# Patient Record
Sex: Male | Born: 1950 | Race: White | Hispanic: No | Marital: Married | Smoking: Former smoker
Health system: Southern US, Community
[De-identification: ages and names within clinical notes are randomized; demographics above are authoritative.]

## PROBLEM LIST (undated history)

## (undated) DIAGNOSIS — R6889 Other general symptoms and signs: Secondary | ICD-10-CM

## (undated) DIAGNOSIS — J45909 Unspecified asthma, uncomplicated: Secondary | ICD-10-CM

## (undated) DIAGNOSIS — I330 Acute and subacute infective endocarditis: Secondary | ICD-10-CM

## (undated) DIAGNOSIS — R55 Syncope and collapse: Secondary | ICD-10-CM

## (undated) DIAGNOSIS — Z8701 Personal history of pneumonia (recurrent): Secondary | ICD-10-CM

## (undated) DIAGNOSIS — E039 Hypothyroidism, unspecified: Secondary | ICD-10-CM

## (undated) DIAGNOSIS — K219 Gastro-esophageal reflux disease without esophagitis: Secondary | ICD-10-CM

## (undated) HISTORY — DX: Other general symptoms and signs: R68.89

## (undated) HISTORY — PX: ESOPHAGOGASTRODUODENOSCOPY: SHX1529

## (undated) HISTORY — DX: Hypothyroidism, unspecified: E03.9

## (undated) HISTORY — PX: TONSILLECTOMY: SHX5217

## (undated) HISTORY — DX: Gastro-esophageal reflux disease without esophagitis: K21.9

## (undated) HISTORY — DX: Unspecified asthma, uncomplicated: J45.909

## (undated) HISTORY — DX: Acute and subacute infective endocarditis: I33.0

## (undated) HISTORY — DX: Syncope and collapse: R55

## (undated) HISTORY — PX: CARDIAC CATHETERIZATION: SHX172

## (undated) HISTORY — PX: APPENDECTOMY: SHX54

## (undated) HISTORY — DX: Personal history of pneumonia (recurrent): Z87.01

---

## 1995-04-07 DIAGNOSIS — Z8701 Personal history of pneumonia (recurrent): Secondary | ICD-10-CM

## 1995-04-07 HISTORY — DX: Personal history of pneumonia (recurrent): Z87.01

## 1998-09-20 ENCOUNTER — Encounter: Payer: Self-pay | Admitting: Urology

## 1998-09-20 ENCOUNTER — Ambulatory Visit (HOSPITAL_COMMUNITY): Admission: RE | Admit: 1998-09-20 | Discharge: 1998-09-20 | Payer: Self-pay | Admitting: Urology

## 1998-10-14 ENCOUNTER — Ambulatory Visit (HOSPITAL_COMMUNITY): Admission: RE | Admit: 1998-10-14 | Discharge: 1998-10-14 | Payer: Self-pay | Admitting: Gastroenterology

## 2000-04-06 DIAGNOSIS — E039 Hypothyroidism, unspecified: Secondary | ICD-10-CM

## 2000-04-06 DIAGNOSIS — K219 Gastro-esophageal reflux disease without esophagitis: Secondary | ICD-10-CM

## 2000-04-06 HISTORY — DX: Gastro-esophageal reflux disease without esophagitis: K21.9

## 2000-04-06 HISTORY — DX: Hypothyroidism, unspecified: E03.9

## 2000-06-07 ENCOUNTER — Encounter: Admission: RE | Admit: 2000-06-07 | Discharge: 2000-06-07 | Payer: Self-pay | Admitting: Family Medicine

## 2000-06-07 ENCOUNTER — Encounter: Payer: Self-pay | Admitting: Family Medicine

## 2001-03-23 ENCOUNTER — Observation Stay (HOSPITAL_COMMUNITY): Admission: EM | Admit: 2001-03-23 | Discharge: 2001-03-24 | Payer: Self-pay | Admitting: Emergency Medicine

## 2001-03-23 ENCOUNTER — Encounter: Payer: Self-pay | Admitting: Emergency Medicine

## 2001-03-31 ENCOUNTER — Encounter: Admission: RE | Admit: 2001-03-31 | Discharge: 2001-03-31 | Payer: Self-pay | Admitting: Family Medicine

## 2003-03-15 ENCOUNTER — Encounter: Payer: Self-pay | Admitting: Family Medicine

## 2003-03-15 LAB — CONVERTED CEMR LAB: PSA: 0.7 ng/mL

## 2004-02-18 ENCOUNTER — Ambulatory Visit: Payer: Self-pay | Admitting: Family Medicine

## 2004-02-18 LAB — CONVERTED CEMR LAB: PSA: 0.61 ng/mL

## 2004-02-21 ENCOUNTER — Ambulatory Visit: Payer: Self-pay | Admitting: Family Medicine

## 2004-03-07 ENCOUNTER — Ambulatory Visit: Payer: Self-pay | Admitting: Family Medicine

## 2004-08-06 ENCOUNTER — Ambulatory Visit: Payer: Self-pay | Admitting: Internal Medicine

## 2004-11-27 ENCOUNTER — Ambulatory Visit: Payer: Self-pay | Admitting: Family Medicine

## 2004-12-02 ENCOUNTER — Ambulatory Visit: Payer: Self-pay | Admitting: Internal Medicine

## 2005-04-10 ENCOUNTER — Ambulatory Visit: Payer: Self-pay | Admitting: Internal Medicine

## 2005-05-08 ENCOUNTER — Ambulatory Visit: Payer: Self-pay | Admitting: Internal Medicine

## 2005-12-04 ENCOUNTER — Ambulatory Visit: Payer: Self-pay | Admitting: Internal Medicine

## 2006-01-26 ENCOUNTER — Ambulatory Visit: Payer: Self-pay | Admitting: Family Medicine

## 2006-03-23 ENCOUNTER — Ambulatory Visit: Payer: Self-pay | Admitting: Internal Medicine

## 2006-03-24 ENCOUNTER — Ambulatory Visit: Payer: Self-pay | Admitting: Internal Medicine

## 2006-05-06 ENCOUNTER — Ambulatory Visit: Payer: Self-pay | Admitting: Internal Medicine

## 2006-05-31 ENCOUNTER — Ambulatory Visit: Payer: Self-pay | Admitting: Family Medicine

## 2006-05-31 LAB — CONVERTED CEMR LAB
ALT: 21 units/L (ref 0–40)
AST: 23 units/L (ref 0–37)
Albumin: 3.8 g/dL (ref 3.5–5.2)
Alkaline Phosphatase: 91 units/L (ref 39–117)
BUN: 9 mg/dL (ref 6–23)
Bilirubin, Direct: 0.1 mg/dL (ref 0.0–0.3)
CO2: 30 meq/L (ref 19–32)
Calcium: 9.5 mg/dL (ref 8.4–10.5)
Chloride: 106 meq/L (ref 96–112)
Cholesterol: 187 mg/dL (ref 0–200)
Creatinine, Ser: 1 mg/dL (ref 0.4–1.5)
Free T4: 0.7 ng/dL (ref 0.6–1.6)
GFR calc Af Amer: 99 mL/min
GFR calc non Af Amer: 82 mL/min
Glucose, Bld: 114 mg/dL — ABNORMAL HIGH (ref 70–99)
HDL: 39.3 mg/dL (ref >39.0)
LDL Cholesterol: 123 mg/dL — ABNORMAL HIGH (ref 0–99)
PSA: 0.7 ng/mL
PSA: 0.7 ng/mL (ref 0.10–4.00)
Potassium: 4.2 meq/L (ref 3.5–5.1)
Sodium: 143 meq/L (ref 135–145)
TSH: 1.64 microintl units/mL (ref 0.35–5.50)
Total Bilirubin: 0.8 mg/dL (ref 0.3–1.2)
Total CHOL/HDL Ratio: 4.8
Total Protein: 7 g/dL (ref 6.0–8.3)
Triglycerides: 124 mg/dL (ref 0–149)
VLDL: 25 mg/dL (ref 0–40)

## 2006-06-07 ENCOUNTER — Ambulatory Visit: Payer: Self-pay | Admitting: Internal Medicine

## 2006-06-08 ENCOUNTER — Ambulatory Visit: Payer: Self-pay | Admitting: Family Medicine

## 2006-07-30 ENCOUNTER — Ambulatory Visit: Payer: Self-pay | Admitting: Family Medicine

## 2006-08-04 ENCOUNTER — Encounter: Payer: Self-pay | Admitting: Family Medicine

## 2006-08-04 DIAGNOSIS — R7989 Other specified abnormal findings of blood chemistry: Secondary | ICD-10-CM | POA: Insufficient documentation

## 2006-08-04 DIAGNOSIS — G43909 Migraine, unspecified, not intractable, without status migrainosus: Secondary | ICD-10-CM | POA: Insufficient documentation

## 2006-08-05 DIAGNOSIS — E039 Hypothyroidism, unspecified: Secondary | ICD-10-CM

## 2006-08-05 DIAGNOSIS — T7840XA Allergy, unspecified, initial encounter: Secondary | ICD-10-CM | POA: Insufficient documentation

## 2006-08-05 DIAGNOSIS — K219 Gastro-esophageal reflux disease without esophagitis: Secondary | ICD-10-CM | POA: Insufficient documentation

## 2006-08-05 DIAGNOSIS — J309 Allergic rhinitis, unspecified: Secondary | ICD-10-CM | POA: Insufficient documentation

## 2006-08-16 ENCOUNTER — Ambulatory Visit: Payer: Self-pay | Admitting: Family Medicine

## 2006-08-16 DIAGNOSIS — K645 Perianal venous thrombosis: Secondary | ICD-10-CM

## 2008-08-02 ENCOUNTER — Telehealth: Payer: Self-pay | Admitting: Family Medicine

## 2009-02-25 ENCOUNTER — Encounter: Payer: Self-pay | Admitting: Family Medicine

## 2009-02-25 HISTORY — PX: COLONOSCOPY: SHX5424

## 2009-02-25 LAB — HM COLONOSCOPY

## 2009-07-09 ENCOUNTER — Ambulatory Visit: Payer: Self-pay | Admitting: Family Medicine

## 2009-07-09 DIAGNOSIS — R030 Elevated blood-pressure reading, without diagnosis of hypertension: Secondary | ICD-10-CM

## 2009-08-05 ENCOUNTER — Ambulatory Visit: Payer: Self-pay | Admitting: Family Medicine

## 2009-08-05 LAB — CONVERTED CEMR LAB
BUN: 11 mg/dL (ref 6–23)
Basophils Absolute: 0 10*3/uL (ref 0.0–0.1)
Basophils Relative: 0.6 % (ref 0.0–3.0)
CO2: 30 meq/L (ref 19–32)
Calcium: 9.1 mg/dL (ref 8.4–10.5)
Chloride: 105 meq/L (ref 96–112)
Creatinine, Ser: 1 mg/dL (ref 0.4–1.5)
Creatinine,U: 264.7 mg/dL
Eosinophils Absolute: 0.1 10*3/uL (ref 0.0–0.7)
Eosinophils Relative: 2.1 % (ref 0.0–5.0)
Free T4: 1.1 ng/dL (ref 0.6–1.6)
GFR calc non Af Amer: 81.24 mL/min (ref >60)
Glucose, Bld: 100 mg/dL — ABNORMAL HIGH (ref 70–99)
HCT: 41.9 % (ref 39.0–52.0)
Hemoglobin: 14.4 g/dL (ref 13.0–17.0)
Lymphocytes Relative: 22.1 % (ref 12.0–46.0)
Lymphs Abs: 0.9 10*3/uL (ref 0.7–4.0)
MCHC: 34.2 g/dL (ref 30.0–36.0)
MCV: 90.1 fL (ref 78.0–100.0)
Microalb Creat Ratio: 6 mg/g (ref 0.0–30.0)
Microalb, Ur: 1.6 mg/dL (ref 0.0–1.9)
Monocytes Absolute: 0.6 10*3/uL (ref 0.1–1.0)
Monocytes Relative: 14.6 % — ABNORMAL HIGH (ref 3.0–12.0)
Neutro Abs: 2.3 10*3/uL (ref 1.4–7.7)
Neutrophils Relative %: 60.6 % (ref 43.0–77.0)
PSA: 1.07 ng/mL (ref 0.10–4.00)
Platelets: 203 10*3/uL (ref 150.0–400.0)
Potassium: 4.3 meq/L (ref 3.5–5.1)
RBC: 4.65 M/uL (ref 4.22–5.81)
RDW: 14.2 % (ref 11.5–14.6)
Sodium: 142 meq/L (ref 135–145)
TSH: 0.67 microintl units/mL (ref 0.35–5.50)
WBC: 3.9 10*3/uL — ABNORMAL LOW (ref 4.5–10.5)

## 2009-08-06 LAB — CONVERTED CEMR LAB: Vit D, 25-Hydroxy: 19 ng/mL — ABNORMAL LOW

## 2009-08-08 ENCOUNTER — Ambulatory Visit: Payer: Self-pay | Admitting: Family Medicine

## 2009-11-12 ENCOUNTER — Encounter (INDEPENDENT_AMBULATORY_CARE_PROVIDER_SITE_OTHER): Payer: Self-pay | Admitting: *Deleted

## 2010-05-06 NOTE — Assessment & Plan Note (Signed)
Summary: CPX / LFW   Vital Signs:  Patient profile:   60 year old male Weight:      245.75 pounds Temp:     98.2 degrees F oral Pulse rate:   76 / minute Pulse rhythm:   regular BP sitting:   110 / 70  (left arm) Cuff size:   large  Vitals Entered By: Sydell Axon LPN (Aug 09, 2950 10:46 AM) CC: 30 Minute checkup, had a colonoscopy 11/10 by Dr. Ewing Schlein   History of Present Illness: Pt here for Comp Exam. He continues to have right LBP which we discussed.  Preventive Screening-Counseling & Management  Alcohol-Tobacco     Alcohol drinks/day: 0     Smoking Status: quit     Packs/Day: 0.5 at the most     Year Started: 1966     Year Quit: 1977     Pack years: 5  Caffeine-Diet-Exercise     Caffeine use/day: 2     Does Patient Exercise: yes     Type of exercise: walikng     Exercise (avg: min/session): <30     Times/week: 3  Problems Prior to Update: 1)  Special Screening Malignant Neoplasm of Prostate  (ICD-V76.44) 2)  Elevated Blood Pressure Without Diagnosis of Hypertension  (ICD-796.2) 3)  Tendinitis, Patellar  (ICD-726.64) 4)  External Hemorrhoid  (ICD-455.3) 5)  Hemorrhoids, Internal Thrombosed  (ICD-455.1) 6)  Allergy  (ICD-995.3) 7)  Migraine Headache  (ICD-346.90) 8)  Abnormal Blood Chemistry Nec  (ICD-790.6) 9)  Hypothyroidism  (ICD-244.9) 10)  Gerd  (ICD-530.81) 11)  Allergic Rhinitis  (ICD-477.9)  Medications Prior to Update: 1)  Synthroid 50 Mcg Tabs (Levothyroxine Sodium) .... Take One By Mouth Daily 2)  Bd Plastipak Syringes Allergy 28g X 1/2" 1 Ml Misc (Tuberculin-Allergy Syringes) .... Q Week 3)  Nexium 40 Mg Cpdr (Esomeprazole Magnesium) .... Take One By Mouth Daily  Allergies: 1)  ! * Polycillin  Past History:  Past Medical History: Last updated: 08/04/2006 Allergic [id number]/ GERD:2002 Hypothyroidism:2002  Past Surgical History: Last updated: 03/13/2009 Appy 19yoa Tonsillectomy20yoa Pneumonia 1997 SBE 2ND TO HEART MURMUR (DR. YOUNG)    HOSPITALIZED [facility] R/O'S  VIRAL AGE 60-19/2002 Colonoscopy Sm int and ext Hemms  (Dr Ewing Schlein) 02/25/2009         10 years EGD medium HH Esophagitis  (Dr Ewing Schlein)  02/25/2009     Family History: Last updated: 08-27-2009 Father:: DECEASED 65YOA: KIDNEY FAILURE  Mother: DECEASED 47 YOA ,CHF,HTN ,STROKE BROTHER A 67 Stroke  BROTHER A 62 H/O Rheum Fever w/ heart complcns BROTHER A 54  SISTER A 69 Pain COPD CV: +MOTHER, CHF , MANY COUSINS DECEASED MI X2 HBP: + MOTHERS SIDE DM: + MOTHERS SIDE GOUT/ARTHRITIS: PROSTATE/CANCER:PMG LUNG( NON SMOKER) PATERNAL AUNT LIVER BREAST/OVARIAN/UTERINE CANCER: COLON CANCER: DEPRESSION: NEGATIVE ETOH/DRUG ABUSE: NEGATIVE OTHER: + STROKE MOTHER  Social History: Last updated: 08/04/2006 Marital Status: MarriedLIVES WITH WIFE Children: 2 DAUGHTERS OUT OF HOME Occupation: EMERGENCY RESP. TEAM SHIFT LEADER [employer]  Risk Factors: Alcohol Use: 0 (08/27/2009) Caffeine Use: 2 (Aug 27, 2009) Exercise: yes (2009-08-27)  Risk Factors: Smoking Status: quit (2009-08-27) Packs/Day: 0.5 at the most (Aug 27, 2009)  Family History: Father:: DECEASED 65YOA: KIDNEY FAILURE  Mother: DECEASED 60 YOA ,CHF,HTN ,STROKE BROTHER A 67 Stroke  BROTHER A 62 H/O Rheum Fever w/ heart complcns BROTHER A 54  SISTER A 69 Pain COPD CV: +MOTHER, CHF , MANY COUSINS DECEASED MI X2 HBP: + MOTHERS SIDE DM: + MOTHERS SIDE GOUT/ARTHRITIS: PROSTATE/CANCER:PMG LUNG( NON SMOKER) PATERNAL AUNT LIVER  BREAST/OVARIAN/UTERINE CANCER: COLON CANCER: DEPRESSION: NEGATIVE ETOH/DRUG ABUSE: NEGATIVE OTHER: + STROKE MOTHER  Social History: Packs/Day:  0.5 at the most Caffeine use/day:  2 Does Patient Exercise:  yes  Review of Systems General:  Denies chills, fatigue, fever, sweats, weakness, and weight loss. Eyes:  Denies blurring, discharge, and eye pain. ENT:  Complains of ringing in ears; denies ear discharge, earache, and sinus pressure. CV:  Denies chest pain or discomfort, fainting,  fatigue, palpitations, shortness of breath with exertion, swelling of feet, and swelling of hands. Resp:  Denies cough, shortness of breath, and wheezing. GI:  Complains of indigestion; denies abdominal pain, bloody stools, change in bowel habits, constipation, dark tarry stools, diarrhea, loss of appetite, nausea, vomiting, vomiting blood, and yellowish skin color. GU:  Denies dysuria, nocturia, and urinary frequency. MS:  Complains of joint pain and low back pain; denies muscle aches, cramps, and stiffness. Derm:  Denies dryness, itching, and rash. Neuro:  Denies numbness, poor balance, tingling, and tremors.  Physical Exam  General:  Well-developed,well-nourished,in no acute distress; alert,appropriate and cooperative throughout examination Head:  Normocephalic and atraumatic without obvious abnormalities. No apparent alopecia or balding. Sinuses NT. Eyes:  Conjunctiva clear bilaterally.  Ears:  External ear exam shows no significant lesions or deformities.  Otoscopic examination reveals clear canals, tympanic membranes are intact bilaterally without bulging, retraction, inflammation or discharge. Hearing is grossly normal bilaterally. Mildly dull right TM. Nose:  External nasal examination shows no deformity or inflammation. Nasal mucosa are pink and moist without lesions or exudates. Mouth:  Oral mucosa and oropharynx without lesions or exudates.  Teeth in good repair. Neck:  No deformities, masses, or tenderness noted. Chest Wall:  No deformities, masses, tenderness or gynecomastia noted. Breasts:  No masses or gynecomastia noted Lungs:  Normal respiratory effort, chest expands symmetrically. Lungs are clear to auscultation, no crackles or wheezes. Heart:  Normal rate and regular rhythm. S1 and S2 normal without gallop, murmur, click, rub or other extra sounds. Abdomen:  Bowel sounds positive,abdomen soft and non-tender without masses, organomegaly or hernias noted. Rectal:  No external  abnormalities noted. Normal sphincter tone. No rectal masses or tenderness. G neg. Genitalia:  Testes bilaterally descended without nodularity, tenderness or masses. No scrotal masses or lesions. No penis lesions or urethral discharge. Prostate:  Prostate gland firm and smooth, no enlargement, nodularity, tenderness, mass, asymmetry or induration. 20-30gms. Msk:  No deformity or scoliosis noted of thoracic or lumbar spine.   Pulses:  R and L carotid,radial,femoral,dorsalis pedis and posterior tibial pulses are full and equal bilaterally Extremities:  No clubbing, cyanosis, edema, or deformity noted with normal full range of motion of all joints.   Neurologic:  No cranial nerve deficits noted. Station and gait are normal. Plantar reflexes are down-going bilaterally. DTRs are symmetrical throughout. Sensory, motor and coordinative functions appear intact. Skin:  Intact without suspicious lesions or rashes Cervical Nodes:  No lymphadenopathy noted Inguinal Nodes:  No significant adenopathy Psych:  Cognition and judgment appear intact. Alert and cooperative with normal attention span and concentration. No apparent delusions, illusions, hallucinations   Impression & Recommendations:  Problem # 1:  HEALTH MAINTENANCE EXAM (ICD-V70.0) Assessment Comment Only  Reviewed preventive care protocols, scheduled due services, and updated immunizations.  Problem # 2:  SPECIAL SCREENING MALIGNANT NEOPLASM OF PROSTATE (ICD-V76.44) Assessment: Unchanged Stable PSA and exam.  Problem # 3:  ELEVATED BLOOD PRESSURE WITHOUT DIAGNOSIS OF HYPERTENSION (ICD-796.2) Assessment: Improved  NML today.  BP today: 110/70 Prior BP: 118/78 (07/09/2009)  Labs Reviewed: Creat: 1.0 (08/05/2009) Chol: 187 (05/31/2006)   HDL: 39.3 (05/31/2006)   LDL: 123 (05/31/2006)   TG: 124 (05/31/2006)  Instructed in low sodium diet (DASH Handout) and behavior modification.    Problem # 4:  EXTERNAL HEMORRHOID  (ICD-455.3) Assessment: Unchanged Stable, eat fiber and exercise regulatrly. Eat apple daily.  Problem # 5:  ABNORMAL BLOOD CHEMISTRY NEC (ICD-790.6) Assessment: Unchanged Sugar slightly elevated. Cont to try to avboid sweets and carbs.  Problem # 6:  HYPOTHYROIDISM (ICD-244.9) Assessment: Unchanged Adequate, cont curr dose. His updated medication list for this problem includes:    Synthroid 50 Mcg Tabs (Levothyroxine sodium) .Marland Kitchen... Take one by mouth daily  Labs Reviewed: TSH: 0.67 (08/05/2009)    Chol: 187 (05/31/2006)   HDL: 39.3 (05/31/2006)   LDL: 123 (05/31/2006)   TG: 124 (05/31/2006)  Problem # 7:  GERD (ICD-530.81) Assessment: Unchanged Stable . Avoi caffeine and tomato products. His updated medication list for this problem includes:    Nexium 40 Mg Cpdr (Esomeprazole magnesium) .Marland Kitchen... Take one by mouth daily  Diagnostics Reviewed:  Discussed lifestyle modifications, diet, antacids/medications, and preventive measures. Handout provided.   Complete Medication List: 1)  Synthroid 50 Mcg Tabs (Levothyroxine sodium) .... Take one by mouth daily 2)  Bd Plastipak Syringes Allergy 28g X 1/2" 1 Ml Misc (Tuberculin-allergy syringes) .... Q week 3)  Nexium 40 Mg Cpdr (Esomeprazole magnesium) .... Take one by mouth daily  Patient Instructions: 1)  RTC one year, sooner as needed.  Current Allergies (reviewed today): ! * POLYCILLIN

## 2010-05-06 NOTE — Letter (Signed)
Summary: [name] letter  [place] [facility]  [address] [place], [place] [address]   Phone: [phone]  Fax: [phone]       11/12/2009 MRN: [id number]  Timothy Jimenez [address] [place], [place]  [address]  Dear [name],  [facility] [place], and [facility] announce the retirement of [name], M.D., from full-time practice at the [facility] office effective October 03, 2009 and his plans of returning part-time.  It is important to Dr. [name] and to our practice that you understand that [facility] has seven physicians in our office for your health care needs.  We will continue to offer the same exceptional care that you have today.    Dr. [name] has spoken to many of you about his plans for retirement and returning part-time in the fall.   We will continue to work with you through the transition to schedule appointments for you in the office and meet the high standards that [place] is committed to.   Again, it is with great pleasure that we share the news that Dr. [name] will return to [facility] at [facility] in October of 2011 with a reduced schedule.    If you have any questions, or would like to request an appointment with one of our physicians, please call us at [phone] and press the option for Scheduling an appointment.  We take pleasure in providing you with excellent patient care and look forward to seeing you at your next office visit.  Our [facility] Physicians are:  Tillman Abide, M.D. Laurita Quint, M.D. Roxy Manns, M.D. Kerby Nora, M.D. Hannah Beat, M.D. Ruthe Mannan, M.D. We proudly welcomed Raechel Ache, M.D. and Eustaquio Boyden, M.D. to the practice in July/August 2011.  Sincerely,  [facility]
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
## 2010-05-06 NOTE — Letter (Signed)
Summary: Pt's Blood Pressure Readings from 07/04/09-08/05/09  Pt's Blood Pressure Readings from 07/04/09-08/05/09   Imported By: Beau Fanny 08/08/2009 13:55:51  _____________________________________________________________________  External Attachment:    Type:   Image     Comment:   External Document
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
## 2010-08-22 NOTE — Assessment & Plan Note (Signed)
[facility]                             PULMONARY OFFICE NOTE   NAME:Timothy, Jimenez                      MRN:          [id number]  DATE:05/06/2006                            DOB:          02-11-1951    PROBLEM LIST:  1. Allergic asthma.  2. Allergic rhinitis.  3. Esophageal reflux.  4. Migraine.  5. Heart murmur with dental prophylaxis.   HISTORY:  He comes for scheduled followup reporting that he has done  well this winter, no special problems.  There have been no problems or  issues with his allergy vaccine.  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  He has had a little increase in cough this  winter with scant white phlegm.  There has been no change in awareness  of mild dyspnea which he attributes to being somewhat overweight.   MEDICATIONS:  1. Allergy vaccine.  2. Zantac 150 mg.  3. Synthroid 50 mcg.  4. Gabapentin 800 mg.  5. Rescue albuterol inhaler.  6. Amoxicillin for dental prophylaxis are used p.r.n.   No medication allergy.   OBJECTIVE:  Weight 252 pounds, BP 122/70, pulse regular 64 with frequent  extra beats, room air saturation 97% at rest.  He looks relaxed and comfortable.  There is no neck vein distension, stridor, or postnasal drainage.  I cannot hear a heart murmur today through his clothing.  Lung fields are clear with no wheeze, cough, or crackles.  There is no peripheral edema or cyanosis.   IMPRESSION:  1. Asthma with maybe minimal winter time exacerbation, reflected in      minor cough that does not seem to be of enough concern to address      specifically.  2. Allergic rhinitis.  3. Dyspnea consistent mainly with deconditioning and weight gain.   PLAN:  He is encouraged to walk for endurance.  He sees Dr. Allyson Sabal on a  p.r.n. basis.  I call attention to his  extrasystoles as he sees his  physicians going forward.  Schedule return with me in 1 year, earlier  p.r.n.     Clinton D. Maple Hudson, MD, Tonny Bollman, FACP  Electronically Signed    CDY/MedQ  DD: 05/06/2006  DT: 05/06/2006  Job #: [id number]   cc:   [name], MD  Nanetta Batty, M.D.

## 2010-08-22 NOTE — Discharge Summary (Signed)
[place]  Patient:    Timothy Jimenez, WINGERT Visit Number: [id number] MRN: [id number]          Service Type: OBV Location: [id number] Attending Physician:  Tobin Chad Dictated by:   Juanell Fairly, M.D. Admit Date:  03/23/2001 Discharge Date: 03/24/2001   CC:         [facility]   Discharge Summary  DATE OF BIRTH:  24-May-1950  DISCHARGE MEDICATIONS: 1. Aspirin 325 mg p.o. q.d. 2. Phenergan 12.5 to 25 mg p.o. q.4-6h. p.r.n. nausea and vomiting. 3. Lopressor 12.5 mg p.o. b.i.d. 4. He was instructed to resume his home medications.  FOLLOWUP:  He was set up with a followup appointment to see Tomi Bamberger, N.P. at 9:30 a.m. Monday, December 23, at [facility].  DISCHARGE DIAGNOSES: 1. Chest pain with myocardial infarction ruled out. 2. Gastroenteritis, viral.  HOSPITAL COURSE:  [name] is a 60 year old male, with a previous medical history of asthma and chronic headaches, who presented with a seven-hour history of nausea, vomiting, chest pain, and diarrhea.  At approximately 3 p.m. on the day of admission, the patient began feeling nauseous, began vomiting, and began having diarrhea.  There is no trauma, no precipitating events.  The patient ate eggs and sausage and a cookie that day and nothing else.  No one else he knows has been sick.  The chest pain he had began after he had been vomiting a number of times and was consider sharp and cut from his left upper chest through to his scapula.  There were no exacerbating factors, no alleviating factors, and the patient had no similar pain before this event. The patient was admitted.  MI was ruled out by EKG and serial enzymes.  The patient was given Phenergan for his nausea and vomiting, which helped him tremendously.  He was discharged on Phenergan, as well as aspirin and Lopressor due to risk factors of family history of CHF and MI.  I  will leave it to the discretion of his primary whether to continue these or not, as well as whether to do risk stratification for him, although I suggest it be done. Dictated by:   Juanell Fairly, M.D. Attending Physician:  Tobin Chad DD:  03/24/01 TD:  03/25/01 Job: [id number]

## 2010-08-26 ENCOUNTER — Emergency Department (HOSPITAL_COMMUNITY): Payer: 59

## 2010-08-26 ENCOUNTER — Emergency Department (HOSPITAL_COMMUNITY)
Admission: EM | Admit: 2010-08-26 | Discharge: 2010-08-26 | Disposition: A | Discharge status: Home / Self Care | Payer: 59 | Attending: Emergency Medicine | Admitting: Emergency Medicine

## 2010-08-26 DIAGNOSIS — R0789 Other chest pain: Secondary | ICD-10-CM | POA: Insufficient documentation

## 2010-08-26 DIAGNOSIS — R55 Syncope and collapse: Secondary | ICD-10-CM | POA: Insufficient documentation

## 2010-08-26 LAB — CBC
HCT: 41.3 % (ref 39.0–52.0)
Hemoglobin: 14 g/dL (ref 13.0–17.0)
RBC: 4.58 MIL/uL (ref 4.22–5.81)
WBC: 4 10*3/uL (ref 4.0–10.5)

## 2010-08-26 LAB — BASIC METABOLIC PANEL
CO2: 25 mEq/L (ref 19–32)
Glucose, Bld: 109 mg/dL — ABNORMAL HIGH (ref 70–99)
Potassium: 4.1 mEq/L (ref 3.5–5.1)
Sodium: 139 mEq/L (ref 135–145)

## 2010-08-26 LAB — DIFFERENTIAL
Eosinophils Absolute: 0.1 10*3/uL (ref 0.0–0.7)
Eosinophils Relative: 4 % (ref 0–5)
Lymphocytes Relative: 23 % (ref 12–46)
Lymphs Abs: 0.9 10*3/uL (ref 0.7–4.0)
Monocytes Absolute: 0.4 10*3/uL (ref 0.1–1.0)
Monocytes Relative: 10 % (ref 3–12)

## 2010-08-26 LAB — POCT CARDIAC MARKERS
CKMB, poc: <1 ng/mL — ABNORMAL LOW (ref 1.0–8.0)
Troponin i, poc: <0.05 ng/mL (ref 0.00–0.09)
Troponin i, poc: <0.05 ng/mL (ref 0.00–0.09)

## 2010-09-03 ENCOUNTER — Encounter: Payer: Self-pay | Admitting: Family Medicine

## 2010-09-04 ENCOUNTER — Encounter: Payer: Self-pay | Admitting: Family Medicine

## 2010-09-04 ENCOUNTER — Ambulatory Visit (INDEPENDENT_AMBULATORY_CARE_PROVIDER_SITE_OTHER): Payer: 59 | Admitting: Family Medicine

## 2010-09-04 VITALS — BP 100/70 | HR 68 | Temp 97.8°F | Resp 20 | Wt 246.0 lb

## 2010-09-04 DIAGNOSIS — R55 Syncope and collapse: Secondary | ICD-10-CM | POA: Insufficient documentation

## 2010-09-04 MED ORDER — ALBUTEROL SULFATE HFA 108 (90 BASE) MCG/ACT IN AERS: 2.0000 | INHALATION_SPRAY | Freq: Four times a day (QID) | RESPIRATORY_TRACT | Status: DC | PRN

## 2010-09-04 NOTE — Assessment & Plan Note (Signed)
Likely vasovagal with normal w/u at ER and no return of sx.  He does have HH and this may have contributed.  I would work on continued diet modification and add on H2/PPI if any heartburn continues.  Okay for outpatient fu and he agrees.  >25 min spent with face to face with patient >50% counseling.

## 2010-09-04 NOTE — Progress Notes (Signed)
ER recs reviewed.  Last Tuesday he didn't feel well.  He went to take a tylenol and swallowed but "I think I swallowed too hard."  Had tight pressure in L chest and then woke up on the floor.  He likely bumped his L knee on the way done along with his head.  Unknown time down.  CXR and head CT was unremarkable.  EKG and labs unremarkable.  This is the first episode like this.  Has felt well since going home from the hospital except for minimal episodic chest tightness, not as bad as with the episode- occ worse with deep breath.   Off nexium in the meantime- didn't need it with weight loss and diet changes. Known HH on EGD.  Rare feeling of food sticking.  H/o heartburn, variable- less likely to be a problem "if I eat right."  Prev heart cath done.  H/o asthma but hasn't needed inhalers in years.  Quit smoking years ago. Occ wheeze.  No h/o SZ d/o.    PMH and SH reviewed  ROS: See HPI, otherwise noncontributory.  Meds, vitals, and allergies reviewed.   GEN: nad, alert and oriented HEENT: mucous membranes moist NECK: supple w/o LA CV: rrr. PULM: ctab, no inc wob ABD: soft, +bs EXT: no edema SKIN: no acute rash CN 2-12 wnl B, S/S/DTR wnl x4

## 2010-09-04 NOTE — Patient Instructions (Signed)
I would continue to work on your diet. If the heartburn continues, I would take either zantac or prilosec daily.  Use the inhaler if you have wheezing.  If you need the inhaler more than several times in a week, notify us.  Take care.

## 2010-09-10 ENCOUNTER — Other Ambulatory Visit: Payer: Self-pay | Admitting: Family Medicine

## 2010-09-10 DIAGNOSIS — E039 Hypothyroidism, unspecified: Secondary | ICD-10-CM

## 2010-09-10 DIAGNOSIS — R739 Hyperglycemia, unspecified: Secondary | ICD-10-CM

## 2010-09-15 ENCOUNTER — Other Ambulatory Visit (INDEPENDENT_AMBULATORY_CARE_PROVIDER_SITE_OTHER): Payer: 59 | Admitting: Family Medicine

## 2010-09-15 ENCOUNTER — Other Ambulatory Visit: Payer: Self-pay | Admitting: Family Medicine

## 2010-09-15 DIAGNOSIS — Z125 Encounter for screening for malignant neoplasm of prostate: Secondary | ICD-10-CM | POA: Insufficient documentation

## 2010-09-15 DIAGNOSIS — E039 Hypothyroidism, unspecified: Secondary | ICD-10-CM

## 2010-09-15 DIAGNOSIS — R7309 Other abnormal glucose: Secondary | ICD-10-CM

## 2010-09-15 DIAGNOSIS — R739 Hyperglycemia, unspecified: Secondary | ICD-10-CM

## 2010-09-15 LAB — LIPID PANEL
Cholesterol: 183 mg/dL (ref 0–200)
Total CHOL/HDL Ratio: 5
Triglycerides: 122 mg/dL (ref 0.0–149.0)

## 2010-09-18 ENCOUNTER — Encounter: Payer: Self-pay | Admitting: Family Medicine

## 2010-09-18 ENCOUNTER — Ambulatory Visit (INDEPENDENT_AMBULATORY_CARE_PROVIDER_SITE_OTHER): Payer: 59 | Admitting: Family Medicine

## 2010-09-18 VITALS — BP 114/74 | HR 64 | Temp 98.4°F | Ht 74.5 in | Wt 248.0 lb

## 2010-09-18 DIAGNOSIS — E039 Hypothyroidism, unspecified: Secondary | ICD-10-CM

## 2010-09-18 DIAGNOSIS — Z0000 Encounter for general adult medical examination without abnormal findings: Secondary | ICD-10-CM | POA: Insufficient documentation

## 2010-09-18 DIAGNOSIS — R55 Syncope and collapse: Secondary | ICD-10-CM

## 2010-09-18 DIAGNOSIS — Z125 Encounter for screening for malignant neoplasm of prostate: Secondary | ICD-10-CM

## 2010-09-18 DIAGNOSIS — R030 Elevated blood-pressure reading, without diagnosis of hypertension: Secondary | ICD-10-CM

## 2010-09-18 MED ORDER — LEVOTHYROXINE SODIUM 50 MCG PO TABS: 50.0000 ug | ORAL_TABLET | Freq: Every day | ORAL | Status: DC

## 2010-09-18 NOTE — Patient Instructions (Addendum)
Check with your insurance to see if they will cover the shingles shot. I would get a flu shot each fall.   I sent your rx to the mail order.   Take care.  Glad to see you.  I would get another checkup in 1 year.

## 2010-09-18 NOTE — Assessment & Plan Note (Signed)
No new sx.  No further w/u now.

## 2010-09-18 NOTE — Assessment & Plan Note (Signed)
Path phys d/w pt and he understood.  TSH wnl, no neck mass, recheck yearly.

## 2010-09-18 NOTE — Assessment & Plan Note (Signed)
Prev with colonoscopy done.  D/w pt about yearly flu shot.  Already had PNA vaccine done per patient, repeat at 65.  Tdap up to date.  D/w pt about diet and exercise.

## 2010-09-18 NOTE — Progress Notes (Signed)
CPE- See plan.  Routine anticipatory guidance given to patient.  See health maintenance.  Hypothyroid.  No neck pain, mass.  Compliant with meds.    H/o asthma, minimal use of inhaler.  Very rare symptoms.  Doing well.    H/o syncope, no more episodes and feeling well.  One episode in lifetime, total.   PMH and SH reviewed  Meds, vitals, and allergies reviewed.   ROS: See HPI.  Otherwise negative.    GEN: nad, alert and oriented HEENT: mucous membranes moist NECK: supple w/o LA CV: rrr. PULM: ctab, no inc wob ABD: soft, +bs EXT: no edema SKIN: no acute rash Prostate gland firm and smooth, no enlargement, nodularity, tenderness, mass, asymmetry or induration.

## 2010-09-18 NOTE — Assessment & Plan Note (Signed)
BP okay today, no new meds.

## 2010-09-18 NOTE — Assessment & Plan Note (Signed)
PSA options were discussed along with recent recs.  No further indication for psa at this point, since patient is low risk and there is no FH of prosate CA.  He understood.

## 2011-06-18 ENCOUNTER — Ambulatory Visit (INDEPENDENT_AMBULATORY_CARE_PROVIDER_SITE_OTHER): Payer: 59 | Admitting: Family Medicine

## 2011-06-18 ENCOUNTER — Encounter: Payer: Self-pay | Admitting: Family Medicine

## 2011-06-18 VITALS — BP 128/80 | HR 68 | Temp 98.0°F | Wt 258.0 lb

## 2011-06-18 DIAGNOSIS — R21 Rash and other nonspecific skin eruption: Secondary | ICD-10-CM

## 2011-06-18 DIAGNOSIS — G589 Mononeuropathy, unspecified: Secondary | ICD-10-CM

## 2011-06-18 DIAGNOSIS — G629 Polyneuropathy, unspecified: Secondary | ICD-10-CM

## 2011-06-18 LAB — HEMOGLOBIN A1C: Hgb A1c MFr Bld: 5.7 % (ref 4.6–6.5)

## 2011-06-18 MED ORDER — FLUOCINONIDE-E 0.05 % EX CREA: TOPICAL_CREAM | Freq: Two times a day (BID) | CUTANEOUS | Status: AC

## 2011-06-18 NOTE — Progress Notes (Signed)
Subjective:    Patient ID: Timothy Jimenez, male    DOB: 11/08/1950, 61 y.o.   MRN: [id number]  HPI  61 yo male new to me here for:  Rash- started two weeks ago on stomach, legs and neck. Very itchy, sometimes burns when it gets so itchy. Never vesicular, does not drain. Denies any new soaps, detergents or lotions.  Has not been working outside lately.  Never had anything like this before. Not taking any antihistamines.  Patient Active Problem List  Diagnoses  . HYPOTHYROIDISM  . MIGRAINE HEADACHE  . HEMORRHOIDS, INTERNAL THROMBOSED  . ALLERGIC RHINITIS  . GERD  . ELEVATED BLOOD PRESSURE WITHOUT DIAGNOSIS OF HYPERTENSION  . Syncope  . Prostate cancer screening  . Routine general medical examination at a health care facility   Past Medical History  Diagnosis Date  . Allergic rhinitis 1999  . GERD (gastroesophageal reflux disease) 2002  . Hypothyroidism 2002  . History of pneumonia 1997  . SBE (subacute bacterial endocarditis)     2nd to heart murmur (Dr Maple Hudson)  . 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 Syncope     single episode 2012 thought to be vagal episode w/o return of symtpoms   Past Surgical History  Procedure Date  . Appendectomy     61 years old  . Tonsillectomy     20 yoa  . Colonoscopy 02/25/2009    sm int and ext hemms (Dr. Ewing Schlein)   . Esophagogastroduodenoscopy     medium HH esophagitis ( Dr  Ewing Schlein) 02/25/2009  . Cardiac catheterization    History  Substance Use Topics  . Smoking status: Former Games developer  . Smokeless tobacco: Never Used   Comment: stopped amoking 35 years ago  . Alcohol Use: No   Family History  Problem Relation Age of Onset  . Heart disease Brother     rheumatic heart disease  . Heart failure Mother   . Hypertension Mother   . Stroke Mother   . Rheumatic fever Brother     age 10 h/o w heart complications  . Hypertension Brother   . Stroke Brother   . COPD Sister     30 Pain   . Heart disease Other     many cousins deceased MI x2  . Hypertension      mothers side  . Diabetes      mothers side  . Liver disease Paternal Aunt   . Lung cancer      paternal/maternal grandparents  . Kidney failure Father   . Prostate cancer Neg Hx   . Colon cancer Neg Hx    Allergies  Allergen Reactions  . Polycillin (Ampicillin)     Hives    Current Outpatient Prescriptions on File Prior to Visit  Medication Sig Dispense Refill  . albuterol (PROVENTIL HFA) 108 (90 BASE) MCG/ACT inhaler Inhale 2 puffs into the lungs every 6 (six) hours as needed for wheezing.  18 g  2  . Cholecalciferol (VITAMIN D) 1000 UNITS capsule Take 1,000 Units by mouth 2 (two) times daily.        Marland Kitchen levothyroxine (SYNTHROID, LEVOTHROID) 50 MCG tablet Take 1 tablet (50 mcg total) by mouth daily.  90 tablet  3   The PMH, PSH, Social History, Family History, Medications, and allergies have been reviewed in [facility], and have been updated if relevant.  Review of Systems    See HPI No wheezing or SOB Objective:  Physical Exam  Constitutional: He appears well-developed and well-nourished.  HENT:  Head: Normocephalic and atraumatic.  Skin: Skin is warm. Rash noted.       BP 128/80  Pulse 68  Temp(Src) 98 F (36.7 C) (Oral)  Wt 258 lb (117.028 kg)       Assessment & Plan:   1. Rash     New.  Seems consistent with allergic/contact dermitis- trigger unknown. Crosses midline and nonvesicular so not consistent with shingles. Apply lidex twice daily and start antihistamine. Pt to call with update in 1-2 days. The patient indicates understanding of these issues and agrees with the plan.

## 2011-06-18 NOTE — Patient Instructions (Signed)
It was nice to see you. I think this an allergic reaction. Please apply lidex as directed- twice daily to areas. Please start taking Zyrtec daily- over the counter. Call me if no improvement.

## 2011-10-14 ENCOUNTER — Other Ambulatory Visit: Payer: Self-pay | Admitting: Family Medicine

## 2011-10-14 DIAGNOSIS — R030 Elevated blood-pressure reading, without diagnosis of hypertension: Secondary | ICD-10-CM

## 2011-10-14 DIAGNOSIS — Z125 Encounter for screening for malignant neoplasm of prostate: Secondary | ICD-10-CM

## 2011-10-14 DIAGNOSIS — E039 Hypothyroidism, unspecified: Secondary | ICD-10-CM

## 2011-10-16 ENCOUNTER — Other Ambulatory Visit (INDEPENDENT_AMBULATORY_CARE_PROVIDER_SITE_OTHER): Payer: 59

## 2011-10-16 DIAGNOSIS — Z125 Encounter for screening for malignant neoplasm of prostate: Secondary | ICD-10-CM

## 2011-10-16 DIAGNOSIS — E039 Hypothyroidism, unspecified: Secondary | ICD-10-CM

## 2011-10-16 DIAGNOSIS — R030 Elevated blood-pressure reading, without diagnosis of hypertension: Secondary | ICD-10-CM

## 2011-10-16 LAB — COMPREHENSIVE METABOLIC PANEL
AST: 25 U/L (ref 0–37)
BUN: 12 mg/dL (ref 6–23)
Calcium: 9.3 mg/dL (ref 8.4–10.5)
Chloride: 106 mEq/L (ref 96–112)
Creatinine, Ser: 0.9 mg/dL (ref 0.4–1.5)
Glucose, Bld: 114 mg/dL — ABNORMAL HIGH (ref 70–99)

## 2011-10-16 LAB — LIPID PANEL
Cholesterol: 189 mg/dL (ref 0–200)
LDL Cholesterol: 126 mg/dL — ABNORMAL HIGH (ref 0–99)
Triglycerides: 131 mg/dL (ref 0.0–149.0)
VLDL: 26.2 mg/dL (ref 0.0–40.0)

## 2011-10-16 LAB — TSH: TSH: 1.27 u[IU]/mL (ref 0.35–5.50)

## 2011-10-16 LAB — PSA: PSA: 1.31 ng/mL (ref 0.10–4.00)

## 2011-10-23 ENCOUNTER — Ambulatory Visit (INDEPENDENT_AMBULATORY_CARE_PROVIDER_SITE_OTHER): Payer: 59 | Admitting: Family Medicine

## 2011-10-23 ENCOUNTER — Encounter: Payer: Self-pay | Admitting: Family Medicine

## 2011-10-23 ENCOUNTER — Ambulatory Visit (INDEPENDENT_AMBULATORY_CARE_PROVIDER_SITE_OTHER)
Admission: RE | Admit: 2011-10-23 | Discharge: 2011-10-23 | Disposition: A | Discharge status: Home / Self Care | Payer: 59 | Source: Ambulatory Visit | Attending: Family Medicine | Admitting: Family Medicine

## 2011-10-23 VITALS — BP 120/74 | HR 79 | Temp 98.4°F | Wt 261.8 lb

## 2011-10-23 DIAGNOSIS — Z0000 Encounter for general adult medical examination without abnormal findings: Secondary | ICD-10-CM

## 2011-10-23 DIAGNOSIS — J45909 Unspecified asthma, uncomplicated: Secondary | ICD-10-CM

## 2011-10-23 DIAGNOSIS — M79609 Pain in unspecified limb: Secondary | ICD-10-CM

## 2011-10-23 DIAGNOSIS — M79673 Pain in unspecified foot: Secondary | ICD-10-CM

## 2011-10-23 DIAGNOSIS — Z125 Encounter for screening for malignant neoplasm of prostate: Secondary | ICD-10-CM

## 2011-10-23 DIAGNOSIS — Z23 Encounter for immunization: Secondary | ICD-10-CM

## 2011-10-23 DIAGNOSIS — E039 Hypothyroidism, unspecified: Secondary | ICD-10-CM

## 2011-10-23 MED ORDER — LEVOTHYROXINE SODIUM 50 MCG PO TABS: 50.0000 ug | ORAL_TABLET | Freq: Every day | ORAL | Status: DC

## 2011-10-23 NOTE — Patient Instructions (Addendum)
Check with your insurance to see if they will cover the shingles shot. I would get a flu shot each fall.   Use the inhaler with the cough/tightness consistently and let me know if you don't improve.  If you improve but need the inhaler frequently, notify us.  Take care.  I would get a thicker insert for your foot with more arch support.  If that doesn't help then I want you to follow up with Dr. Patsy Lager here.

## 2011-10-23 NOTE — Progress Notes (Signed)
CPE- See plan.  Routine anticipatory guidance given to patient.  See health maintenance. Tetanus done at CPE 2013 Flu shot done yearly PNA due at 65.  Shingles shot d/w pt.  Prostate cancer screening and PSA options (with potential risks and benefits of testing vs not testing) were discussed along with recent recs/guidelines.  PSA wnl. He would like to continue checking.   Colonoscopy done 2010.  Living will d/w pt.  He'll consider and talk to family.   Hypothyroid.  No ADE from meds.  No neck pain, mass.  TSH wnl.   Cough.  Intermittent.  6 weeks.  He'll get chest tightness rarely, hasn't used inhaler much at all.  H/o asthma.   R foot pain.  Over last few weeks. Pain on the dorsum of distal lateral 3 MTs.  Pain on standing, better sitting.  No pain on ROM of toes.  5th MT is also TTP mid shaft.   Skin changes note prev.  Abd rash resolved.  Still with a mild post inflammatory pigmentation on R lower leg.  O/w resolved.    PMH and SH reviewed  Meds, vitals, and allergies reviewed.   ROS: See HPI.  Otherwise negative.    GEN: nad, alert and oriented HEENT: mucous membranes moist NECK: supple w/o LA CV: rrr. PULM: ctab, no inc wob ABD: soft, +bs EXT: no edema SKIN: no acute rash mild post inflammatory pigmentation on R lower leg DRE deferred. R foot with minimally tender 5th MT but able to bear weight.  Also ttp along dorsum of midfoot along 3rd-5th  MTs

## 2011-10-26 ENCOUNTER — Encounter: Payer: Self-pay | Admitting: *Deleted

## 2011-10-26 DIAGNOSIS — M79673 Pain in unspecified foot: Secondary | ICD-10-CM | POA: Insufficient documentation

## 2011-10-26 DIAGNOSIS — J45909 Unspecified asthma, uncomplicated: Secondary | ICD-10-CM | POA: Insufficient documentation

## 2011-10-26 NOTE — Assessment & Plan Note (Signed)
Prostate cancer screening and PSA options (with potential risks and benefits of testing vs not testing) were discussed along with recent recs/guidelines. PSA wnl. He would like to continue checking.

## 2011-10-26 NOTE — Assessment & Plan Note (Signed)
Likely cause of cough.  He'll use SABA and f/u prn.  See instructions.

## 2011-10-26 NOTE — Assessment & Plan Note (Signed)
Routine anticipatory guidance given to patient. See health maintenance.  Tetanus done at CPE 2013  Flu shot done yearly  PNA due at 65.  Shingles shot d/w pt.  Prostate cancer screening and PSA options (with potential risks and benefits of testing vs not testing) were discussed along with recent recs/guidelines. PSA wnl. He would like to continue checking.  Colonoscopy done 2010.  Living will d/w pt. He'll consider and talk to family.

## 2011-10-26 NOTE — Assessment & Plan Note (Signed)
Replaced, continue current meds.

## 2011-10-26 NOTE — Assessment & Plan Note (Signed)
He needs more arch support, he'll get inserts and f/u prn.  Imaging reviewed.

## 2012-03-18 IMAGING — CT CT HEAD W/O CM
1 series · 16 of 30 positions shown, 20 images · non-contrast
Comparison: None.

CLINICAL DATA: Syncope

CT HEAD WITHOUT CONTRAST
TECHNIQUE: Contiguous axial images were obtained from the base of
the skull through the vertex without contrast.

[Series 2: headtrauma 4.8 h37s · axial · 0.46mm/px · z∈[+75,+235]mm · 16 of 36 slices shown, 20 images]
[im 2/36  brain]
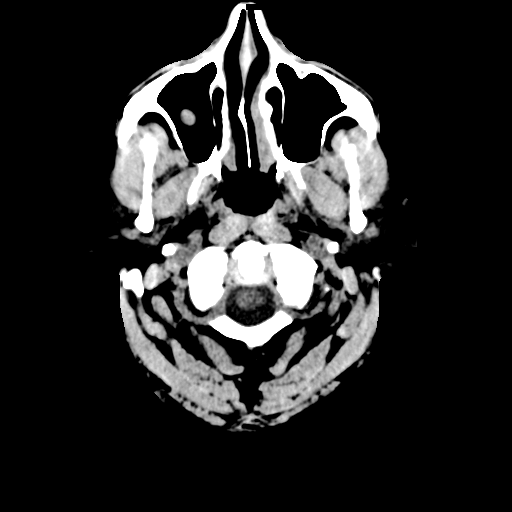
[im 2/36  bone]
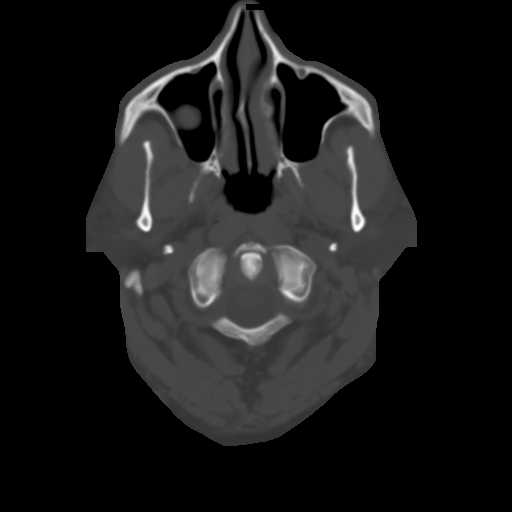
[im 4/36  brain]
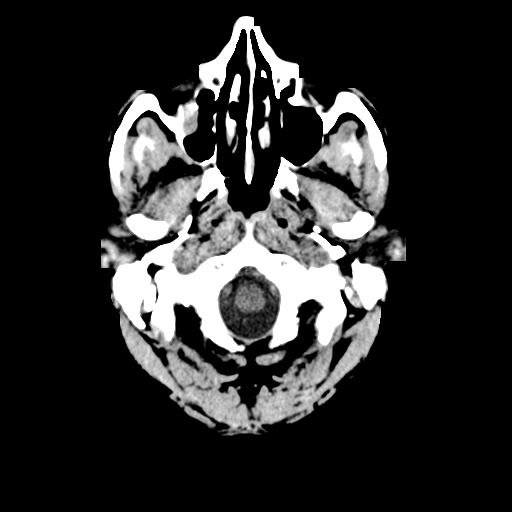
[im 7/36  brain]
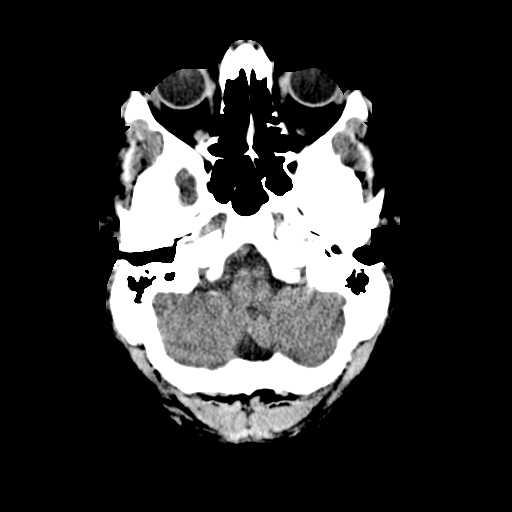
[im 9/36  brain]
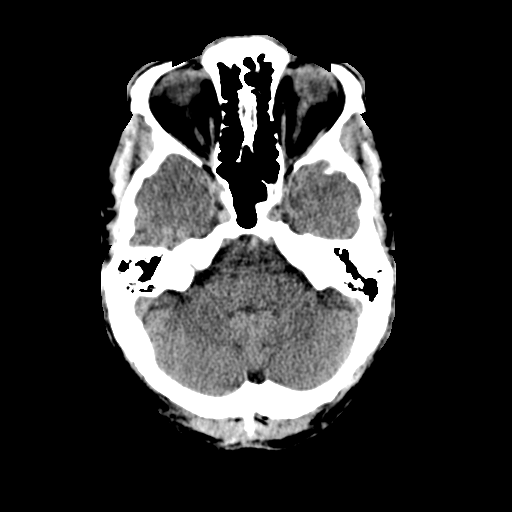
[im 10/36  brain]
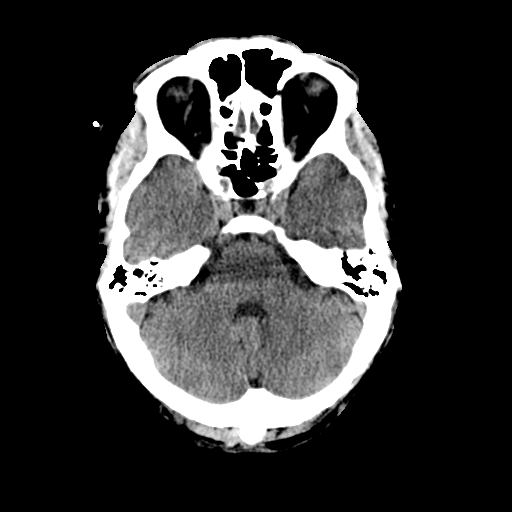
[im 10/36  bone]
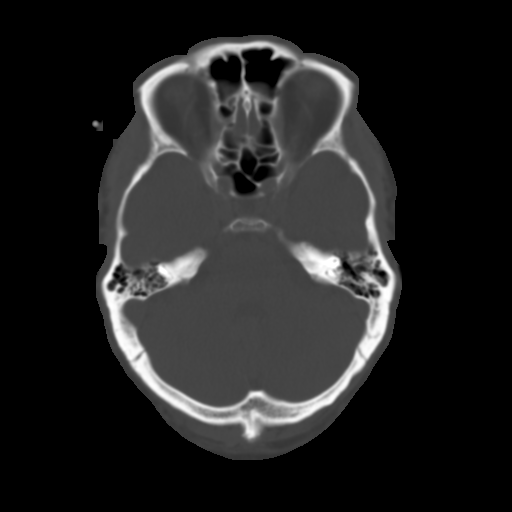
[im 13/36  brain]
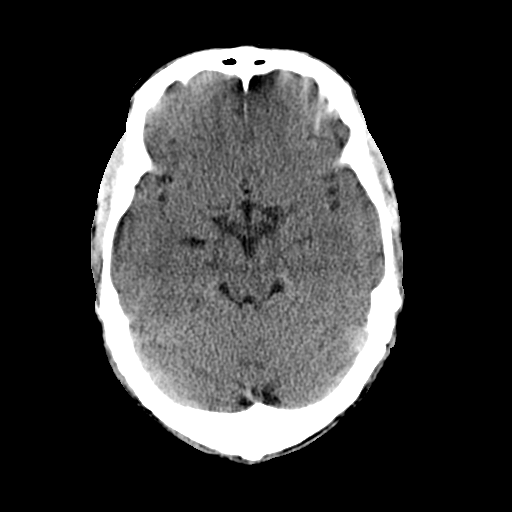
[im 15/36  brain]
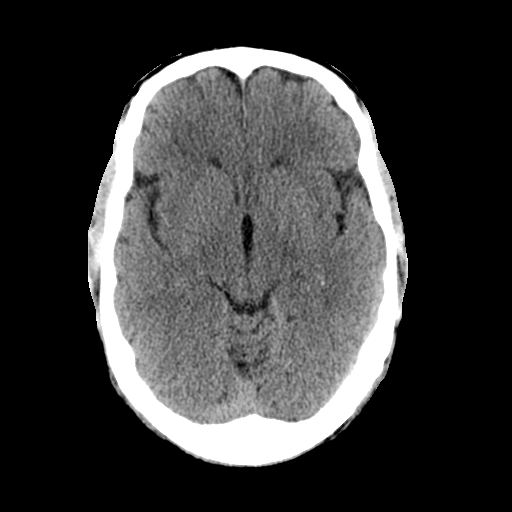
[im 17/36  brain]
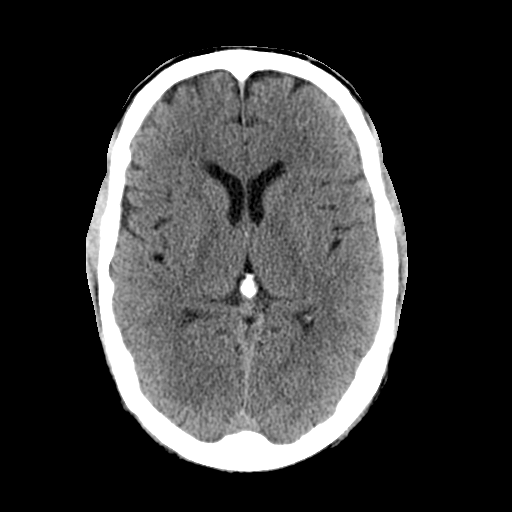
[im 19/36  brain]
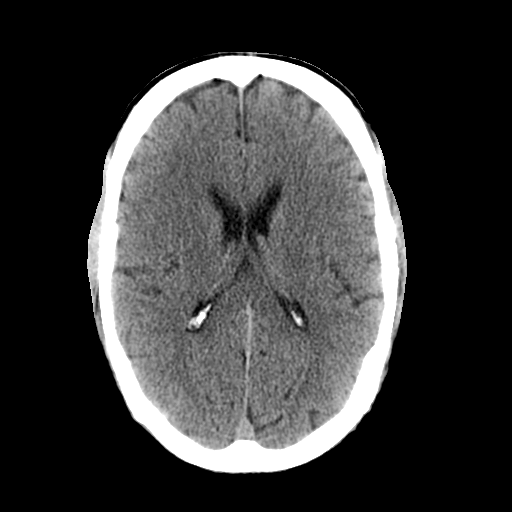
[im 19/36  bone]
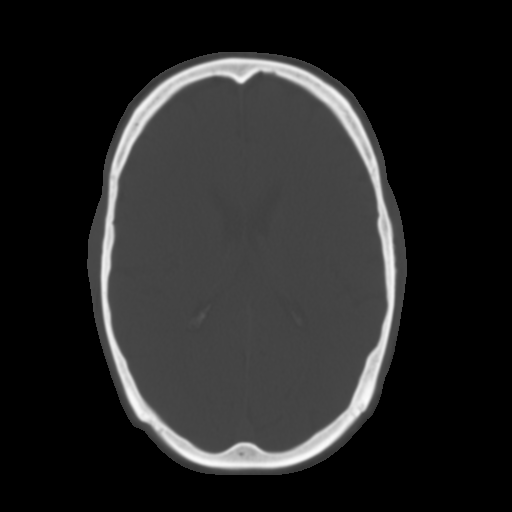
[im 21/36  brain]
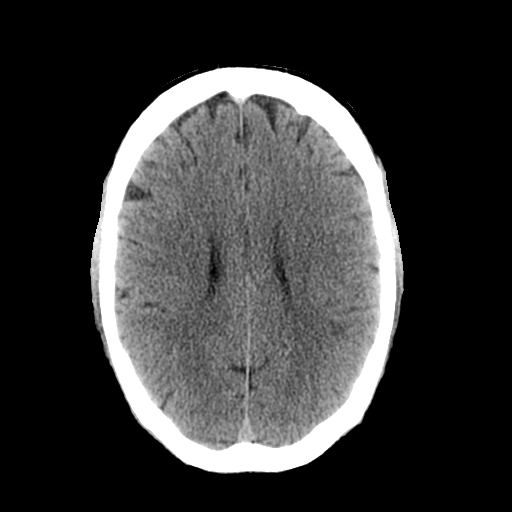
[im 23/36  brain]
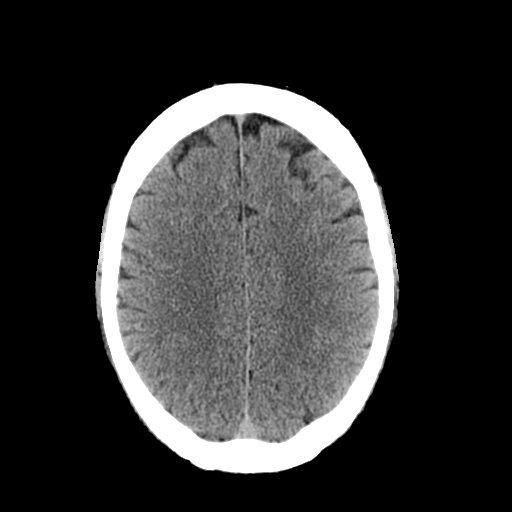
[im 26/36  brain]
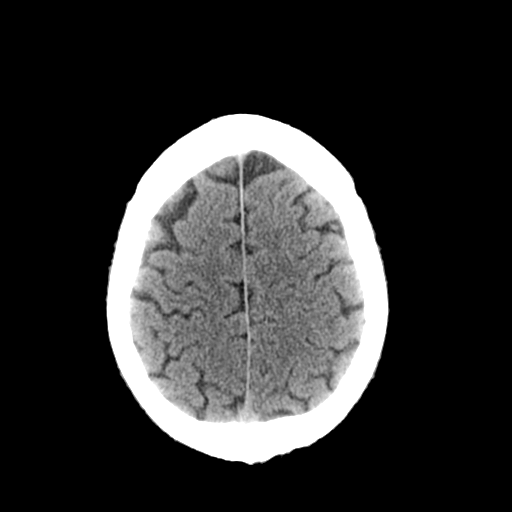
[im 27/36  brain]
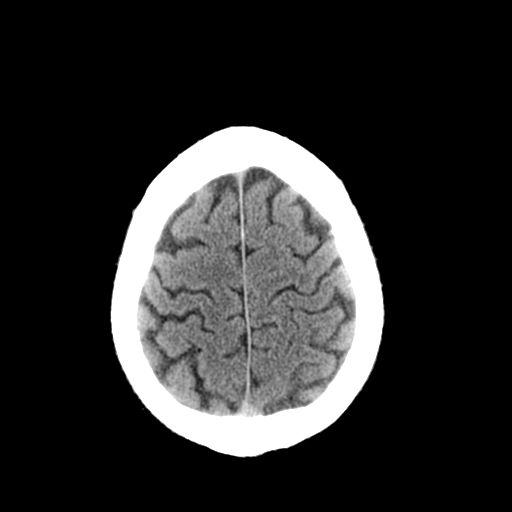
[im 27/36  bone]
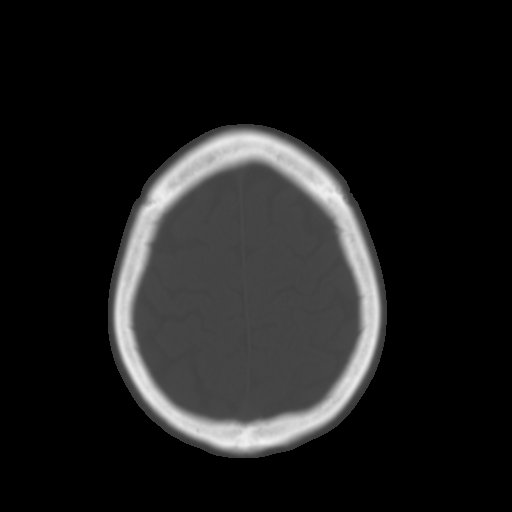
[im 29/36  brain]
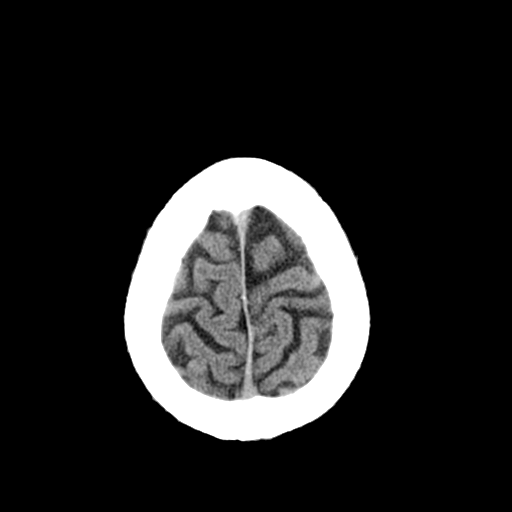
[im 32/36  brain]
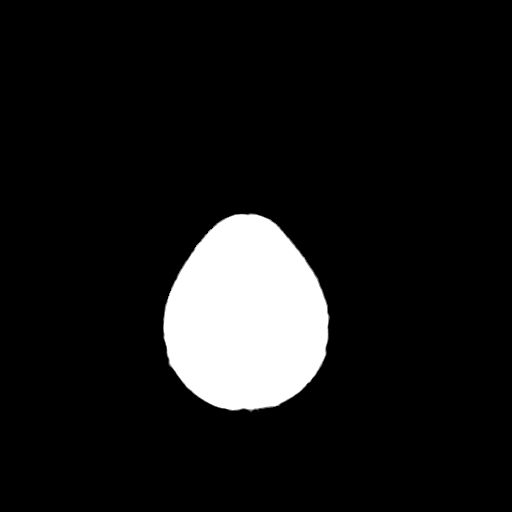
[im 34/36  brain]
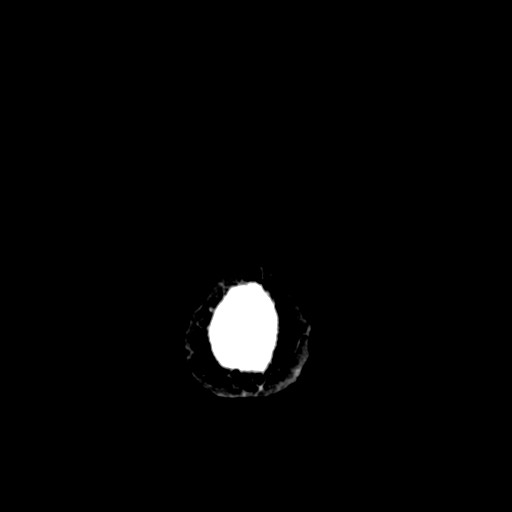

[16 of 30 positions shown; findings below may reference images not displayed]

FINDINGS: No acute intracranial hemorrhage.  No focal mass lesion.
No CT evidence of acute infarction.  No midline shift or mass
effect.  No hydrocephalus.  Basilar cisterns are patent.

There is partial opacification of the right mastoid air cells.
There is a rounded mucosal lesion within the right maxillary sinus.
IMPRESSION: 1.   No acute intracranial findings.

2.  Polypoid mucosal lesion in the right maxillary sinus
represents a benign mucosal polyp or a mucous retention cyst.

## 2012-11-25 ENCOUNTER — Other Ambulatory Visit: Payer: Self-pay | Admitting: Family Medicine

## 2012-11-25 NOTE — Telephone Encounter (Signed)
Electronic refill request.  Patient is due for CPE.  Please advise.

## 2012-11-25 NOTE — Telephone Encounter (Signed)
Sent.  Schedule CPE.   

## 2012-11-28 NOTE — Telephone Encounter (Signed)
Patient advised.  Will call to schedule ASAP.

## 2013-02-22 ENCOUNTER — Other Ambulatory Visit: Payer: Self-pay | Admitting: Family Medicine

## 2013-11-13 ENCOUNTER — Other Ambulatory Visit: Payer: Self-pay | Admitting: *Deleted

## 2013-11-13 MED ORDER — LEVOTHYROXINE SODIUM 50 MCG PO TABS: ORAL_TABLET | ORAL | Status: DC

## 2013-11-16 ENCOUNTER — Other Ambulatory Visit: Payer: Self-pay | Admitting: Family Medicine

## 2013-11-16 DIAGNOSIS — R739 Hyperglycemia, unspecified: Secondary | ICD-10-CM

## 2013-11-16 DIAGNOSIS — E039 Hypothyroidism, unspecified: Secondary | ICD-10-CM

## 2013-11-21 ENCOUNTER — Other Ambulatory Visit (INDEPENDENT_AMBULATORY_CARE_PROVIDER_SITE_OTHER): Payer: 59

## 2013-11-21 DIAGNOSIS — E039 Hypothyroidism, unspecified: Secondary | ICD-10-CM

## 2013-11-21 DIAGNOSIS — R739 Hyperglycemia, unspecified: Secondary | ICD-10-CM

## 2013-11-21 DIAGNOSIS — R030 Elevated blood-pressure reading, without diagnosis of hypertension: Secondary | ICD-10-CM

## 2013-11-21 DIAGNOSIS — R7309 Other abnormal glucose: Secondary | ICD-10-CM

## 2013-11-21 LAB — LIPID PANEL
CHOLESTEROL: 169 mg/dL (ref 0–200)
HDL: 36.5 mg/dL — ABNORMAL LOW (ref >39.00)
LDL Cholesterol: 119 mg/dL — ABNORMAL HIGH (ref 0–99)
NonHDL: 132.5
TRIGLYCERIDES: 67 mg/dL (ref 0.0–149.0)
Total CHOL/HDL Ratio: 5
VLDL: 13.4 mg/dL (ref 0.0–40.0)

## 2013-11-21 LAB — COMPREHENSIVE METABOLIC PANEL
ALBUMIN: 4 g/dL (ref 3.5–5.2)
ALT: 26 U/L (ref 0–53)
AST: 20 U/L (ref 0–37)
Alkaline Phosphatase: 78 U/L (ref 39–117)
BUN: 12 mg/dL (ref 6–23)
CALCIUM: 9.1 mg/dL (ref 8.4–10.5)
CHLORIDE: 106 meq/L (ref 96–112)
CO2: 28 mEq/L (ref 19–32)
Creatinine, Ser: 1 mg/dL (ref 0.4–1.5)
GFR: 80.09 mL/min (ref >60.00)
GLUCOSE: 97 mg/dL (ref 70–99)
POTASSIUM: 4 meq/L (ref 3.5–5.1)
Sodium: 140 mEq/L (ref 135–145)
Total Bilirubin: 1 mg/dL (ref 0.2–1.2)
Total Protein: 7.3 g/dL (ref 6.0–8.3)

## 2013-11-21 LAB — TSH: TSH: 0.6 u[IU]/mL (ref 0.35–4.50)

## 2013-11-27 ENCOUNTER — Encounter (INDEPENDENT_AMBULATORY_CARE_PROVIDER_SITE_OTHER): Payer: Self-pay

## 2013-11-27 ENCOUNTER — Ambulatory Visit (INDEPENDENT_AMBULATORY_CARE_PROVIDER_SITE_OTHER): Payer: 59 | Admitting: Family Medicine

## 2013-11-27 ENCOUNTER — Encounter: Payer: Self-pay | Admitting: Family Medicine

## 2013-11-27 VITALS — BP 134/72 | HR 73 | Temp 97.9°F | Ht 75.0 in | Wt 253.0 lb

## 2013-11-27 DIAGNOSIS — E039 Hypothyroidism, unspecified: Secondary | ICD-10-CM

## 2013-11-27 DIAGNOSIS — Z0000 Encounter for general adult medical examination without abnormal findings: Secondary | ICD-10-CM

## 2013-11-27 DIAGNOSIS — Z7189 Other specified counseling: Secondary | ICD-10-CM

## 2013-11-27 MED ORDER — LEVOTHYROXINE SODIUM 50 MCG PO TABS: ORAL_TABLET | ORAL | Status: DC

## 2013-11-27 MED ORDER — ALBUTEROL SULFATE HFA 108 (90 BASE) MCG/ACT IN AERS: 2.0000 | INHALATION_SPRAY | Freq: Four times a day (QID) | RESPIRATORY_TRACT | Status: AC | PRN

## 2013-11-27 NOTE — Progress Notes (Signed)
Pre visit review using our clinic review tool, if applicable. No additional management support is needed unless otherwise documented below in the visit note.  CPE- See plan.  Routine anticipatory guidance given to patient.  See health maintenance. Tetanus 2013 Shingles shot done at pharmacy 2015.  PNA due at 65 Flu shot prev done- done yearly at work.  Colonoscopy 2010.  10 year f/u.   Prostate cancer screening and PSA options (with potential risks and benefits of testing vs not testing) were discussed along with recent recs/guidelines.  He declined testing PSA at this point. Living will d/w pt.  Wife designated if patient were incapacitated.   Diet and exercise d/w pt.  Both are "fair".  Intentional weight loss in the last few months, "in a controlled way."  He cut back on soda.    Hypothyroid- compliant with meds.  TSH wnl.  No neck mass.  No dysphagia.  Labs d/w pt.   Sugar improved with weight loss.  Chol at goal, labs d/w pt.    PMH and SH reviewed  Meds, vitals, and allergies reviewed.   ROS: See HPI.  Otherwise negative.    GEN: nad, alert and oriented HEENT: mucous membranes moist NECK: supple w/o LA, no tmg CV: rrr. PULM: ctab, no inc wob ABD: soft, +bs EXT: no edema SKIN: no acute rash

## 2013-11-27 NOTE — Assessment & Plan Note (Signed)
Routine anticipatory guidance given to patient.  See health maintenance. Tetanus 2013 Shingles shot done at pharmacy 2015.  PNA due at 65 Flu shot prev done- done yearly at work.  Colonoscopy 2010.  10 year f/u.   Prostate cancer screening and PSA options (with potential risks and benefits of testing vs not testing) were discussed along with recent recs/guidelines.  He declined testing PSA at this point. Living will d/w pt.  Wife designated if patient were incapacitated.   Diet and exercise d/w pt.  Both are "fair".  Intentional weight loss in the last few months, "in a controlled way."  He cut back on soda.

## 2013-11-27 NOTE — Patient Instructions (Signed)
Take care.  Glad to see you.   I would get a flu shot each fall.   Keep exercising.  Call back as needed.

## 2013-11-28 NOTE — Assessment & Plan Note (Signed)
tsh wnl, d/w pt.  No tmg on exam. Continue as is.  Doing well.  He agrees.

## 2014-02-28 ENCOUNTER — Encounter: Payer: Self-pay | Admitting: Internal Medicine

## 2014-02-28 ENCOUNTER — Ambulatory Visit (INDEPENDENT_AMBULATORY_CARE_PROVIDER_SITE_OTHER): Payer: 59 | Admitting: Internal Medicine

## 2014-02-28 VITALS — BP 124/82 | HR 76 | Temp 98.1°F | Wt 252.0 lb

## 2014-02-28 DIAGNOSIS — K648 Other hemorrhoids: Secondary | ICD-10-CM

## 2014-02-28 DIAGNOSIS — K644 Residual hemorrhoidal skin tags: Secondary | ICD-10-CM

## 2014-02-28 MED ORDER — HYDROCORTISONE ACE-PRAMOXINE 1-1 % RE CREA: 1.0000 "application " | TOPICAL_CREAM | Freq: Two times a day (BID) | RECTAL | Status: DC

## 2014-02-28 NOTE — Progress Notes (Addendum)
Subjective:    Patient ID: Timothy Jimenez, male    DOB: [birth year], 63 y.o.   MRN: [id number]  HPI  Pt presents to the clinic today with c/o an external hemorrhoid. He noticed this 1 day ago. He has noticed pain and swelling in the area. He has not noticed any rectal bleeding or blood in his stool. He reports he has not been constipated or strained while going to the bathroom. He has not tried anything OTC. He has tried a Futures trader and cold packs without relief. He has a history of thrombosed internal hemorrhoids.  Review of Systems      Past Medical History  Diagnosis Date  . Allergic rhinitis 1999  . GERD (gastroesophageal reflux disease) 2002  . Hypothyroidism 2002  . History of pneumonia 1997  . SBE (subacute bacterial endocarditis)     2nd to heart murmur (Dr Annamaria Boots)  . 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 Syncope     single episode 2012 thought to be vagal episode w/o return of symtpoms  . Asthma     Current Outpatient Prescriptions  Medication Sig Dispense Refill  . albuterol (PROVENTIL HFA) 108 (90 BASE) MCG/ACT inhaler Inhale 2 puffs into the lungs every 6 (six) hours as needed for wheezing. 18 g 2  . Cholecalciferol (VITAMIN D) 1000 UNITS capsule Take 1,000 Units by mouth 2 (two) times daily.      Marland Kitchen esomeprazole (NEXIUM) 20 MG capsule Take 20 mg by mouth daily.    Marland Kitchen levothyroxine (SYNTHROID) 50 MCG tablet TAKE 1 TABLET DAILY 90 tablet 3  . Multiple Vitamin (MULTIVITAMIN) tablet Take 1 tablet by mouth daily.     No current facility-administered medications for this visit.    Allergies  Allergen Reactions  . Polycillin [Ampicillin]     Hives     Family History  Problem Relation Age of Onset  . Heart failure Mother   . Hypertension Mother   . Stroke Mother   . Rheumatic fever Brother     age 29 h/o w heart complications  . Hypertension Brother   . Stroke Brother   . Heart disease Brother   . COPD Sister   .  Heart disease Other     many cousins deceased MI x2  . Hypertension      mothers side  . Diabetes      mothers side  . Lung cancer      paternal/maternal grandparents  . Liver disease Paternal Aunt   . Kidney failure Father   . Prostate cancer Neg Hx   . Colon cancer Neg Hx     History   Social History  . Marital Status: Married    Spouse Name: N/A    Number of Children: N/A  . Years of Education: N/A   Occupational History  . Not on file.   Social History Main Topics  . Smoking status: Former Research scientist (life sciences)  . Smokeless tobacco: Never Used     Comment: stopped amoking 35 years ago  . Alcohol Use: No  . Drug Use: No  . Sexual Activity: Not on file   Other Topics Concern  . Not on file   Social History Narrative   Last updated: 08/04/2006   Marital Status: Married [place]: [place]   Working for emergency response/security at [employer]     Constitutional: Denies fever, malaise, fatigue, headache  or abrupt weight changes.  Gastrointestinal: Pt reports hemorrhoid. Denies abdominal pain, bloating, constipation, diarrhea or blood in the stool.    No other specific complaints in a complete review of systems (except as listed in HPI above).  Objective:   Physical Exam   BP 124/82 mmHg  Pulse 76  Temp(Src) 98.1 F (36.7 C) (Oral)  Wt 252 lb (114.306 kg)  SpO2 98% Wt Readings from Last 3 Encounters:  02/28/14 252 lb (114.306 kg)  11/27/13 253 lb (114.76 kg)  10/23/11 261 lb 12 oz (118.729 kg)    General: Appears his stated age, well developed, well nourished in NAD. Cardiovascular: Normal rate and rhythm. S1,S2 noted.  No murmur, rubs or gallops noted.  Pulmonary/Chest: Normal effort and positive vesicular breath sounds. No respiratory distress. No wheezes, rales or ronchi noted.  Abdomen: Soft and nontender. Normal bowel sounds, no bruits noted. No distention or masses noted. Liver, spleen and kidneys non palpable. Rectal:  He does not feel comfortable with me looking at it, would rather have Dr. Damita Dunnings look at it if it does not improve.  BMET    Component Value Date/Time   NA 140 11/21/2013 0809   K 4.0 11/21/2013 0809   CL 106 11/21/2013 0809   CO2 28 11/21/2013 0809   GLUCOSE 97 11/21/2013 0809   BUN 12 11/21/2013 0809   CREATININE 1.0 11/21/2013 0809   CALCIUM 9.1 11/21/2013 0809   GFRNONAA >60 08/26/2010 1053   GFRAA  08/26/2010 1053    >60        The eGFR has been calculated using the MDRD equation. This calculation has not been validated in all clinical situations. eGFR's persistently <60 mL/min signify possible Chronic Kidney Disease.    Lipid Panel     Component Value Date/Time   CHOL 169 11/21/2013 0809   TRIG 67.0 11/21/2013 0809   HDL 36.50* 11/21/2013 0809   CHOLHDL 5 11/21/2013 0809   VLDL 13.4 11/21/2013 0809   LDLCALC 119* 11/21/2013 0809    CBC    Component Value Date/Time   WBC 4.0 08/26/2010 1053   RBC 4.58 08/26/2010 1053   HGB 14.0 08/26/2010 1053   HCT 41.3 08/26/2010 1053   PLT 182 08/26/2010 1053   MCV 90.2 08/26/2010 1053   MCH 30.6 08/26/2010 1053   MCHC 33.9 08/26/2010 1053   RDW 12.8 08/26/2010 1053   LYMPHSABS 0.9 08/26/2010 1053   MONOABS 0.4 08/26/2010 1053   EOSABS 0.1 08/26/2010 1053   BASOSABS 0.0 08/26/2010 1053    Hgb A1C Lab Results  Component Value Date   HGBA1C 5.7 06/18/2011        Assessment & Plan:   External Hemorrhoid:  RX for Anal pram BID until resolved Drink plenty of water and use stool softener if needed to avoid constipation and straining Sitz baths are good  RTC if issue persist > 2 weeks or worsens during that time

## 2014-02-28 NOTE — Patient Instructions (Signed)

## 2014-02-28 NOTE — Progress Notes (Signed)
Pre visit review using our clinic review tool, if applicable. No additional management support is needed unless otherwise documented below in the visit note. 

## 2014-05-02 ENCOUNTER — Other Ambulatory Visit: Payer: Self-pay | Admitting: Family Medicine

## 2014-11-12 ENCOUNTER — Other Ambulatory Visit: Payer: Self-pay | Admitting: *Deleted

## 2014-11-12 MED ORDER — LEVOTHYROXINE SODIUM 50 MCG PO TABS: 50.0000 ug | ORAL_TABLET | Freq: Every day | ORAL | Status: DC

## 2014-11-12 NOTE — Telephone Encounter (Signed)
Faxed refill request. Last Filled:    90 tablet 1 RF on 05/02/2014  Patient has appt scheduled for CPE and labs in late August 2016

## 2014-11-13 ENCOUNTER — Other Ambulatory Visit: Payer: Self-pay | Admitting: *Deleted

## 2014-11-23 ENCOUNTER — Telehealth: Payer: Self-pay

## 2014-11-23 ENCOUNTER — Other Ambulatory Visit: Payer: Self-pay | Admitting: Family Medicine

## 2014-11-23 ENCOUNTER — Other Ambulatory Visit (INDEPENDENT_AMBULATORY_CARE_PROVIDER_SITE_OTHER): Payer: BLUE CROSS/BLUE SHIELD

## 2014-11-23 DIAGNOSIS — E039 Hypothyroidism, unspecified: Secondary | ICD-10-CM

## 2014-11-23 DIAGNOSIS — Z125 Encounter for screening for malignant neoplasm of prostate: Secondary | ICD-10-CM

## 2014-11-23 LAB — BASIC METABOLIC PANEL
BUN: 13 mg/dL (ref 6–23)
CALCIUM: 9.3 mg/dL (ref 8.4–10.5)
CO2: 28 mEq/L (ref 19–32)
CREATININE: 1.06 mg/dL (ref 0.40–1.50)
Chloride: 107 mEq/L (ref 96–112)
GFR: 74.64 mL/min (ref >60.00)
Glucose, Bld: 100 mg/dL — ABNORMAL HIGH (ref 70–99)
Potassium: 4.2 mEq/L (ref 3.5–5.1)
Sodium: 142 mEq/L (ref 135–145)

## 2014-11-23 LAB — TSH: TSH: 0.97 u[IU]/mL (ref 0.35–4.50)

## 2014-11-23 NOTE — Telephone Encounter (Signed)
Pt came in earlier for labs--pt wanted to know if you could order PSA---I did draw for the test--so if you approve please order  thanks

## 2014-11-23 NOTE — Telephone Encounter (Signed)
Ordered. Thanks

## 2014-11-23 NOTE — Addendum Note (Signed)
Addended by: Joaquim Nam on: 11/23/2014 02:01 PM   Modules accepted: Orders

## 2014-11-29 ENCOUNTER — Other Ambulatory Visit: Payer: Self-pay | Admitting: Family Medicine

## 2014-11-29 ENCOUNTER — Encounter: Payer: Self-pay | Admitting: Family Medicine

## 2014-11-29 ENCOUNTER — Ambulatory Visit (INDEPENDENT_AMBULATORY_CARE_PROVIDER_SITE_OTHER): Payer: BLUE CROSS/BLUE SHIELD | Admitting: Family Medicine

## 2014-11-29 VITALS — BP 118/68 | HR 74 | Temp 98.2°F | Wt 227.4 lb

## 2014-11-29 DIAGNOSIS — R21 Rash and other nonspecific skin eruption: Secondary | ICD-10-CM | POA: Diagnosis not present

## 2014-11-29 DIAGNOSIS — Z0000 Encounter for general adult medical examination without abnormal findings: Secondary | ICD-10-CM | POA: Diagnosis not present

## 2014-11-29 DIAGNOSIS — Z119 Encounter for screening for infectious and parasitic diseases, unspecified: Secondary | ICD-10-CM

## 2014-11-29 DIAGNOSIS — E039 Hypothyroidism, unspecified: Secondary | ICD-10-CM | POA: Diagnosis not present

## 2014-11-29 MED ORDER — LEVOTHYROXINE SODIUM 50 MCG PO TABS: ORAL_TABLET | ORAL | Status: DC

## 2014-11-29 NOTE — Patient Instructions (Signed)
Go to the lab on the way out.  We'll contact you with your lab report. Don't change your thyroid medicine for now.  Take care.  Glad to see you.  I would get a flu shot each fall.   Use OTC lamisil or clotrimazole cream for the rash.  It looks fungal and that may help.  Update me as needed.

## 2014-11-29 NOTE — Progress Notes (Signed)
Pre visit review using our clinic review tool, if applicable. No additional management support is needed unless otherwise documented below in the visit note.  CPE- See plan.  Routine anticipatory guidance given to patient.  See health maintenance. Tetanus 2013 Shingles shot done at pharmacy 2015. PNA due at 65 Flu shot prev done- done yearly at work.  Colonoscopy 2010. 10 year f/u.  Prostate cancer screening and PSA options (with potential risks and benefits of testing vs not testing) were discussed along with recent recs/guidelines. He declined testing PSA at this point. Living will d/w pt. Wife designated if patient were incapacitated.  Diet and exercise d/w pt. Both are good. Intentional weight loss in the last few months, "in a controlled way." He cut back on soda/sweets.  Intentional weight loss noted. He feels better.   D/w pt about HIV and HCV screening.    B axillary rash.  Going on for about 6 weeks.  Already changed deodorant.  Prev with burning sensation (unclear if related to prev deodorant), resolved not.  Not itchy.  Not draining.    Hypothyroidism.  Compliant.  TSH wnl.  No neck mass, no dysphagia.  PMH and SH reviewed  Meds, vitals, and allergies reviewed.   ROS: See HPI.  Otherwise negative.    GEN: nad, alert and oriented HEENT: mucous membranes moist NECK: supple w/o LA CV: rrr. PULM: ctab, no inc wob ABD: soft, +bs EXT: no edema SKIN: no acute rash except for B diffuse axillary blanching rash, macular, no fluctuant mass.  No LA.

## 2014-11-30 LAB — HEPATITIS C ANTIBODY: HCV Ab: NEGATIVE

## 2014-11-30 LAB — HIV ANTIBODY (ROUTINE TESTING W REFLEX): HIV 1&2 Ab, 4th Generation: NONREACTIVE

## 2014-11-30 NOTE — Assessment & Plan Note (Signed)
Controlled, continue as is.  TSH wnl.  No tmg on exam.

## 2014-11-30 NOTE — Assessment & Plan Note (Signed)
Routine anticipatory guidance given to patient. See health maintenance.  Tetanus 2013  Shingles shot done at pharmacy 2015.  PNA due at 65  Flu shot prev done- done yearly at work.  Colonoscopy 2010. 10 year f/u.  Prostate cancer screening and PSA options (with potential risks and benefits of testing vs not testing) were discussed along with recent recs/guidelines. He declined testing PSA at this point.  Living will d/w pt. Wife designated if patient were incapacitated.  Diet and exercise d/w pt. Both are good. Intentional weight loss in the last few months, "in a controlled way." He cut back on soda/sweets. Intentional weight loss noted. He feels better.  D/w pt about HIV and HCV screening.

## 2014-11-30 NOTE — Assessment & Plan Note (Signed)
This looks fungal.  D/w pt.  Would continue current brand deodorant but use topical antifungal at night.  May need to get a new stick of the current deodorant to prev re-exposure.  This should resolve.  F/u prn.  He agrees.

## 2014-12-31 ENCOUNTER — Telehealth: Payer: Self-pay | Admitting: *Deleted

## 2014-12-31 DIAGNOSIS — Z125 Encounter for screening for malignant neoplasm of prostate: Secondary | ICD-10-CM

## 2014-12-31 NOTE — Telephone Encounter (Signed)
Patient left a voicemail stating that when he had his last lab work done a PSA was not done. Patient stated that he talked with Dr. Para March and he does not feel that it is that important. Patient stated that he wants to have a PSA done and needs help with getting this done. Patient stated that he was told by [facility] that he can go to [facility] and get this done and he found out that he has to have an order first. Please advise.

## 2014-12-31 NOTE — Telephone Encounter (Signed)
It's not just that I didn't think it was important.  The guidelines from the USPSTF recommend against routine screening due to the likely lack of benefit from the test.   That said, please fax over an order with the following:  PSA.  Dx Z12.5.    Thanks.

## 2015-01-01 NOTE — Telephone Encounter (Signed)
Please put in order for PSA for patient. Thank you!

## 2015-01-02 ENCOUNTER — Encounter: Payer: Self-pay | Admitting: *Deleted

## 2015-01-02 NOTE — Telephone Encounter (Signed)
PSA orders placed. They are visible at any [facility] drawing station via the [facility] interface x 6 months. I will also mail a copy of the orders to pt.

## 2015-01-11 ENCOUNTER — Encounter: Payer: Self-pay | Admitting: *Deleted

## 2015-01-11 LAB — PSA: PROSTATE SPECIFIC AG, SERUM: 1.2 ng/mL (ref 0.0–4.0)

## 2015-02-19 ENCOUNTER — Encounter: Payer: Self-pay | Admitting: Internal Medicine

## 2015-04-03 NOTE — Progress Notes (Unsigned)
Fasting Glucose: 100 taken by Lynann BeaverSandra Rakestraw RN

## 2015-09-03 ENCOUNTER — Encounter: Payer: Self-pay | Admitting: Family Medicine

## 2015-09-03 ENCOUNTER — Ambulatory Visit (INDEPENDENT_AMBULATORY_CARE_PROVIDER_SITE_OTHER): Payer: Commercial Managed Care - HMO | Admitting: Family Medicine

## 2015-09-03 ENCOUNTER — Other Ambulatory Visit: Payer: Commercial Managed Care - HMO

## 2015-09-03 VITALS — BP 110/64 | HR 62 | Temp 98.0°F | Resp 16 | Ht 75.0 in | Wt 221.0 lb

## 2015-09-03 DIAGNOSIS — Z7189 Other specified counseling: Secondary | ICD-10-CM | POA: Diagnosis not present

## 2015-09-03 DIAGNOSIS — E039 Hypothyroidism, unspecified: Secondary | ICD-10-CM

## 2015-09-03 DIAGNOSIS — Z23 Encounter for immunization: Secondary | ICD-10-CM

## 2015-09-03 DIAGNOSIS — Z7689 Persons encountering health services in other specified circumstances: Secondary | ICD-10-CM

## 2015-09-03 DIAGNOSIS — Z0000 Encounter for general adult medical examination without abnormal findings: Secondary | ICD-10-CM

## 2015-09-03 LAB — LIPID PANEL
CHOLESTEROL: 146 mg/dL (ref 125–200)
HDL: 47 mg/dL (ref >=40)
LDL CALC: 88 mg/dL (ref <130)
TRIGLYCERIDES: 54 mg/dL (ref <150)
Total CHOL/HDL Ratio: 3.1 Ratio (ref <=5.0)
VLDL: 11 mg/dL (ref <30)

## 2015-09-03 LAB — COMPLETE METABOLIC PANEL WITH GFR
ALT: 15 U/L (ref 9–46)
AST: 17 U/L (ref 10–35)
Albumin: 4.4 g/dL (ref 3.6–5.1)
Alkaline Phosphatase: 76 U/L (ref 40–115)
BUN: 20 mg/dL (ref 7–25)
CO2: 24 mmol/L (ref 20–31)
CREATININE: 0.97 mg/dL (ref 0.70–1.25)
Calcium: 9.2 mg/dL (ref 8.6–10.3)
Chloride: 105 mmol/L (ref 98–110)
GFR, EST NON AFRICAN AMERICAN: 82 mL/min (ref >=60)
Glucose, Bld: 98 mg/dL (ref 70–99)
POTASSIUM: 4.4 mmol/L (ref 3.5–5.3)
Sodium: 140 mmol/L (ref 135–146)
Total Bilirubin: 0.5 mg/dL (ref 0.2–1.2)
Total Protein: 7.1 g/dL (ref 6.1–8.1)

## 2015-09-03 LAB — TSH: TSH: 2.26 mIU/L (ref 0.40–4.50)

## 2015-09-03 LAB — CBC WITH DIFFERENTIAL/PLATELET
BASOS ABS: 41 {cells}/uL (ref 0–200)
BASOS PCT: 1 %
EOS ABS: 123 {cells}/uL (ref 15–500)
Eosinophils Relative: 3 %
HEMATOCRIT: 43.1 % (ref 38.5–50.0)
Hemoglobin: 14 g/dL (ref 13.0–17.0)
LYMPHS PCT: 27 %
Lymphs Abs: 1107 cells/uL (ref 850–3900)
MCH: 30.1 pg (ref 27.0–33.0)
MCHC: 32.5 g/dL (ref 32.0–36.0)
MCV: 92.7 fL (ref 80.0–100.0)
MONO ABS: 492 {cells}/uL (ref 200–950)
MPV: 10.5 fL (ref 7.5–12.5)
Monocytes Relative: 12 %
NEUTROS PCT: 57 %
Neutro Abs: 2337 cells/uL (ref 1500–7800)
PLATELETS: 199 10*3/uL (ref 140–400)
RBC: 4.65 MIL/uL (ref 4.20–5.80)
RDW: 13.8 % (ref 11.0–15.0)
WBC: 4.1 10*3/uL (ref 3.8–10.8)

## 2015-09-03 MED ORDER — LEVOTHYROXINE SODIUM 50 MCG PO TABS: ORAL_TABLET | ORAL | Status: DC
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
## 2015-09-05 ENCOUNTER — Encounter: Payer: Self-pay | Admitting: *Deleted

## 2015-09-30 NOTE — Addendum Note (Signed)
Addended by: Legrand RamsWILLIS, SANDY B on: 09/30/2015 11:59 AM   Modules accepted: Orders

## 2016-02-14 ENCOUNTER — Telehealth (INDEPENDENT_AMBULATORY_CARE_PROVIDER_SITE_OTHER): Payer: Self-pay | Admitting: Orthopaedic Surgery

## 2016-02-14 DIAGNOSIS — M545 Low back pain: Secondary | ICD-10-CM

## 2016-02-14 MED ORDER — METHYLPREDNISOLONE 4 MG PO TBPK: ORAL_TABLET | ORAL | 0 refills | Status: DC

## 2016-02-14 NOTE — Telephone Encounter (Signed)
Script called to pharmacy

## 2016-02-14 NOTE — Telephone Encounter (Signed)
Patient called needing Rx refilled (prednisone)  The number to contact him is [phone]

## 2016-02-14 NOTE — Telephone Encounter (Signed)
Ok refill thanks 

## 2016-02-14 NOTE — Telephone Encounter (Signed)
Please advise. Ok for refill?

## 2016-02-14 NOTE — Telephone Encounter (Signed)
Called to pharmacy 

## 2016-03-03 ENCOUNTER — Ambulatory Visit (INDEPENDENT_AMBULATORY_CARE_PROVIDER_SITE_OTHER): Payer: Commercial Managed Care - HMO | Admitting: Family Medicine

## 2016-03-03 ENCOUNTER — Encounter: Payer: Self-pay | Admitting: Family Medicine

## 2016-03-03 VITALS — BP 120/60 | HR 72 | Temp 98.6°F | Resp 16 | Ht 75.0 in | Wt 235.0 lb

## 2016-03-03 DIAGNOSIS — M5431 Sciatica, right side: Secondary | ICD-10-CM

## 2016-03-03 DIAGNOSIS — Z125 Encounter for screening for malignant neoplasm of prostate: Secondary | ICD-10-CM

## 2016-03-03 DIAGNOSIS — E039 Hypothyroidism, unspecified: Secondary | ICD-10-CM | POA: Diagnosis not present

## 2016-03-03 LAB — TSH: TSH: 1.78 mIU/L (ref 0.40–4.50)

## 2016-03-03 MED ORDER — GABAPENTIN 100 MG PO CAPS: 100.0000 mg | ORAL_CAPSULE | Freq: Three times a day (TID) | ORAL | 3 refills | Status: DC

## 2016-03-03 NOTE — Progress Notes (Signed)
Subjective:    Patient ID: Timothy Jimenez, male    DOB: November 16, 1950, 65 y.o.   MRN: [id number]  HPI  09/03/15 The patient is a very pleasant 65 year old white male here today to establish care. He is due for Pneumovax 23. His shingles vaccine and his tetanus shot are up-to-date. Patient had a colonoscopy in 2010 under the care of Dr. Ewing SchleinMagod.  This was reportedly normal and is not due again until 2020. He has had HIV and hepatitis C screening as his previous primary care physician. PSA was last checked in October. I will perform a digital rectal exam today but he is not due for repeat PSA until this fall. He denies any concerns.  At that time, my plan was:  Patient's physical exam today is completely normal. He received Pneumovax 23. I'll check a CBC, CMP, fasting lipid panel, and a TSH as the patient is on levothyroxine for hypothyroidism. I will see the patient back in 6 months to recheck his TSH. At that time we can perform a PSA. His digital rectal exam today is normal. The remainder of his preventative care is up-to-date.  03/03/16 He is here today for follow-up. He is due for a PSA to screen for prostate cancer. His last PSA was in October 2016. He is also due to recheck his thyroid. If his thyroid test is stable this time, I would recommend screening the patient's TSH every 12 months. Overall he is doing extremely well. He has been dealing with low back pain on an intermittent basis. The pain radiates into his right leg.  He has seen Dr. Ophelia CharterYates was performed an MRI and has informed him that he has a bulging disc. He is taking meloxicam for this with moderate success. However he states with prolonged standing and walking at work, his leg will go numb and feel like it's falling asleep. Symptoms are limited to the right leg Past Medical History:  Diagnosis Date  . Allergic rhinitis 1999  . Asthma   . GERD (gastroesophageal reflux disease) 2002  . History of pneumonia 1997  . Hypothyroidism 2002    . 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 SBE (subacute bacterial endocarditis)    2nd to heart murmur (Dr Maple HudsonYoung)  . Syncope    single episode 2012 thought to be vagal episode w/o return of symtpoms   Past Surgical History:  Procedure Laterality Date  . APPENDECTOMY     65 years old  . CARDIAC CATHETERIZATION    . COLONOSCOPY  02/25/2009   sm int and ext hemms (Dr. Ewing SchleinMagod)   . ESOPHAGOGASTRODUODENOSCOPY     medium HH esophagitis ( Dr  Ewing SchleinMagod) 02/25/2009  . TONSILLECTOMY     20 yoa   Current Outpatient Prescriptions on File Prior to Visit  Medication Sig Dispense Refill  . levothyroxine (SYNTHROID) 50 MCG tablet TAKE 1 TABLET DAILY 90 tablet 3  . Multiple Vitamin (MULTIVITAMIN) tablet Take 1 tablet by mouth daily.    . Probiotic Product (PROBIOTIC DAILY PO) Take by mouth.     No current facility-administered medications on file prior to visit.    Allergies  Allergen Reactions  . Polycillin [Ampicillin]     Hives    Social History   Social History  . Marital status: Married    Spouse name: N/A  . Number of children: N/A  . Years of education: N/A   Occupational History  . Not on file.  Social History Main Topics  . Smoking status: Former Games developermoker  . Smokeless tobacco: Never Used     Comment: stopped amoking 35 years ago  . Alcohol use No  . Drug use: No  . Sexual activity: Not on file   Other Topics Concern  . 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 Heart failure Mother   . Hypertension Mother   . Stroke Mother   . Rheumatic fever Brother     age 562 h/o w heart complications  . Hypertension Brother   . Stroke Brother   . Heart disease Brother   . COPD Sister   . Heart disease Other     many  cousins deceased MI x2  . Hypertension      mothers side  . Diabetes      mothers side  . Lung cancer      paternal/maternal grandparents  . Liver disease Paternal Aunt   . Kidney failure Father   . Prostate cancer Neg Hx   . Colon cancer Neg Hx       Review of Systems  All other systems reviewed and are negative.      Objective:   Physical Exam  Constitutional: He is oriented to person, place, and time. He appears well-developed and well-nourished. No distress.  HENT:  Head: Normocephalic and atraumatic.  Right Ear: External ear normal.  Left Ear: External ear normal.  Nose: Nose normal.  Mouth/Throat: Oropharynx is clear and moist. No oropharyngeal exudate.  Eyes: Conjunctivae and EOM are normal. Pupils are equal, round, and reactive to light. Right eye exhibits no discharge. Left eye exhibits no discharge. No scleral icterus.  Neck: Normal range of motion. Neck supple. No JVD present. No tracheal deviation present. No thyromegaly present.  Cardiovascular: Normal rate, regular rhythm, normal heart sounds and intact distal pulses.  Exam reveals no gallop and no friction rub.   No murmur heard. Pulmonary/Chest: Effort normal and breath sounds normal. No stridor. No respiratory distress. He has no wheezes. He has no rales. He exhibits no tenderness.  Abdominal: Soft. Bowel sounds are normal. He exhibits no distension and no mass. There is no tenderness. There is no rebound and no guarding.  Genitourinary: Rectum normal, prostate normal and penis normal.  Musculoskeletal: Normal range of motion. He exhibits no edema or tenderness.  Lymphadenopathy:    He has no cervical adenopathy.  Neurological: He is alert and oriented to person, place, and time. He has normal reflexes. No cranial nerve deficit. He exhibits normal muscle tone. Coordination normal.  Skin: Skin is warm. No rash noted. He is not diaphoretic. No erythema. No pallor.  Psychiatric: He has a normal mood and affect.  His behavior is normal. Judgment and thought content normal.  Vitals reviewed.         Assessment & Plan:  Right sided sciatica - Plan: gabapentin (NEURONTIN) 100 MG capsule  Hypothyroidism, unspecified type - Plan: TSH  Prostate cancer screening - Plan: PSA Digital rectal exam is performed today and prostate is normal in texture without nodularity. I will check a PSA. All the patient's hearing will also check a TSH. TSH is within therapeutic range, I would recommend repeating a TSH in 12 months. Regarding his  sciatica, I will start the patient on Neurontin 100 mg by mouth every 8 hours when necessarypain. Patient is instructed to wean him gradually on the medication. If he is seeing any benefit at all, we can increase the medication to 300 mg 3 times a day. He can call me by telephone and give me an update on the success of the medication.

## 2016-03-04 LAB — PSA: PSA: 1.1 ng/mL (ref <=4.0)

## 2016-07-22 ENCOUNTER — Telehealth: Payer: Self-pay | Admitting: Family Medicine

## 2016-07-22 MED ORDER — LEVOTHYROXINE SODIUM 50 MCG PO TABS: ORAL_TABLET | ORAL | 3 refills | Status: DC

## 2016-07-22 NOTE — Telephone Encounter (Signed)
Medication called/sent to requested pharmacy  

## 2016-07-22 NOTE — Telephone Encounter (Signed)
Pt needs synthroid refilled by [facility]. They state he has no refills left on med. Please call him back.

## 2016-08-06 ENCOUNTER — Other Ambulatory Visit: Payer: Self-pay | Admitting: Family Medicine

## 2016-08-06 DIAGNOSIS — R55 Syncope and collapse: Secondary | ICD-10-CM

## 2016-08-06 DIAGNOSIS — E039 Hypothyroidism, unspecified: Secondary | ICD-10-CM

## 2016-08-06 DIAGNOSIS — Z79899 Other long term (current) drug therapy: Secondary | ICD-10-CM

## 2016-08-06 DIAGNOSIS — K219 Gastro-esophageal reflux disease without esophagitis: Secondary | ICD-10-CM

## 2016-08-06 DIAGNOSIS — Z0000 Encounter for general adult medical examination without abnormal findings: Secondary | ICD-10-CM

## 2016-08-06 DIAGNOSIS — Z125 Encounter for screening for malignant neoplasm of prostate: Secondary | ICD-10-CM

## 2016-08-07 ENCOUNTER — Other Ambulatory Visit: Payer: Commercial Managed Care - HMO

## 2016-08-07 DIAGNOSIS — Z79899 Other long term (current) drug therapy: Secondary | ICD-10-CM

## 2016-08-07 DIAGNOSIS — Z0000 Encounter for general adult medical examination without abnormal findings: Secondary | ICD-10-CM

## 2016-08-07 DIAGNOSIS — Z125 Encounter for screening for malignant neoplasm of prostate: Secondary | ICD-10-CM

## 2016-08-07 DIAGNOSIS — K219 Gastro-esophageal reflux disease without esophagitis: Secondary | ICD-10-CM

## 2016-08-07 DIAGNOSIS — E039 Hypothyroidism, unspecified: Secondary | ICD-10-CM

## 2016-08-07 DIAGNOSIS — R55 Syncope and collapse: Secondary | ICD-10-CM

## 2016-08-07 LAB — CBC WITH DIFFERENTIAL/PLATELET
BASOS PCT: 1 %
Basophils Absolute: 41 cells/uL (ref 0–200)
Eosinophils Absolute: 123 cells/uL (ref 15–500)
Eosinophils Relative: 3 %
HCT: 41.6 % (ref 38.5–50.0)
Hemoglobin: 13.8 g/dL (ref 13.0–17.0)
LYMPHS PCT: 27 %
Lymphs Abs: 1107 cells/uL (ref 850–3900)
MCH: 30.3 pg (ref 27.0–33.0)
MCHC: 33.2 g/dL (ref 32.0–36.0)
MCV: 91.2 fL (ref 80.0–100.0)
MONOS PCT: 14 %
MPV: 9.7 fL (ref 7.5–12.5)
Monocytes Absolute: 574 cells/uL (ref 200–950)
NEUTROS PCT: 55 %
Neutro Abs: 2255 cells/uL (ref 1500–7800)
PLATELETS: 199 10*3/uL (ref 140–400)
RBC: 4.56 MIL/uL (ref 4.20–5.80)
RDW: 14 % (ref 11.0–15.0)
WBC: 4.1 10*3/uL (ref 3.8–10.8)

## 2016-08-08 LAB — LIPID PANEL
CHOL/HDL RATIO: 3.5 ratio (ref <5.0)
Cholesterol: 155 mg/dL (ref <200)
HDL: 44 mg/dL (ref >40)
LDL Cholesterol: 100 mg/dL — ABNORMAL HIGH (ref <100)
Triglycerides: 56 mg/dL (ref <150)
VLDL: 11 mg/dL (ref <30)

## 2016-08-08 LAB — COMPLETE METABOLIC PANEL WITH GFR
ALBUMIN: 4.2 g/dL (ref 3.6–5.1)
ALK PHOS: 76 U/L (ref 40–115)
ALT: 13 U/L (ref 9–46)
AST: 18 U/L (ref 10–35)
BUN: 15 mg/dL (ref 7–25)
CHLORIDE: 106 mmol/L (ref 98–110)
CO2: 23 mmol/L (ref 20–31)
Calcium: 8.9 mg/dL (ref 8.6–10.3)
Creat: 0.98 mg/dL (ref 0.70–1.25)
GFR, EST NON AFRICAN AMERICAN: 80 mL/min (ref >=60)
GFR, Est African American: >89 mL/min (ref >=60)
GLUCOSE: 105 mg/dL — AB (ref 70–99)
POTASSIUM: 4 mmol/L (ref 3.5–5.3)
SODIUM: 139 mmol/L (ref 135–146)
Total Bilirubin: 0.6 mg/dL (ref 0.2–1.2)
Total Protein: 6.6 g/dL (ref 6.1–8.1)

## 2016-08-08 LAB — TSH: TSH: 2.98 mIU/L (ref 0.40–4.50)

## 2016-08-08 LAB — PSA: PSA: 1 ng/mL (ref <=4.0)

## 2016-08-10 ENCOUNTER — Encounter: Payer: Commercial Managed Care - HMO | Admitting: Family Medicine

## 2016-09-01 ENCOUNTER — Ambulatory Visit (INDEPENDENT_AMBULATORY_CARE_PROVIDER_SITE_OTHER): Payer: Commercial Managed Care - HMO | Admitting: Family Medicine

## 2016-09-01 ENCOUNTER — Encounter: Payer: Self-pay | Admitting: Family Medicine

## 2016-09-01 VITALS — BP 138/70 | HR 80 | Temp 98.2°F | Resp 16 | Ht 75.0 in | Wt 245.0 lb

## 2016-09-01 DIAGNOSIS — Z23 Encounter for immunization: Secondary | ICD-10-CM

## 2016-09-01 DIAGNOSIS — Z0000 Encounter for general adult medical examination without abnormal findings: Secondary | ICD-10-CM

## 2016-09-01 DIAGNOSIS — Z125 Encounter for screening for malignant neoplasm of prostate: Secondary | ICD-10-CM | POA: Diagnosis not present

## 2016-09-01 DIAGNOSIS — E039 Hypothyroidism, unspecified: Secondary | ICD-10-CM

## 2016-09-01 NOTE — Progress Notes (Signed)
Subjective:    Patient ID: Timothy Jimenez, male    DOB: [birth year], 66 y.o.   MRN: [id number]  HPI The patient is a very pleasant 66 year old white male here today for CPE. He is due for Prevnar 13. His shingles vaccine and his tetanus shot are up-to-date. Patient had a colonoscopy in 2010 under the care of Dr. Ewing Schlein.  This was reportedly normal and is not due again until 2020. He has had HIV and hepatitis C screening as his previous primary care physician. Past Medical History:  Diagnosis Date  . Allergic rhinitis 1999  . Asthma   . GERD (gastroesophageal reflux disease) 2002  . History of pneumonia 1997  . Hypothyroidism 2002  . 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 SBE (subacute bacterial endocarditis)    2nd to heart murmur (Dr Maple Hudson)  . Syncope    single episode 2012 thought to be vagal episode w/o return of symtpoms   Past Surgical History:  Procedure Laterality Date  . APPENDECTOMY     66 years old  . CARDIAC CATHETERIZATION    . COLONOSCOPY  02/25/2009   sm int and ext hemms (Dr. Ewing Schlein)   . ESOPHAGOGASTRODUODENOSCOPY     medium HH esophagitis ( Dr  Ewing Schlein) 02/25/2009  . TONSILLECTOMY     20 yoa   Current Outpatient Prescriptions on File Prior to Visit  Medication Sig Dispense Refill  . levothyroxine (SYNTHROID) 50 MCG tablet TAKE 1 TABLET DAILY 90 tablet 3  . Multiple Vitamin (MULTIVITAMIN) tablet Take 1 tablet by mouth daily.    . Probiotic Product (PROBIOTIC DAILY PO) Take by mouth.     No current facility-administered medications on file prior to visit.    Allergies  Allergen Reactions  . Polycillin [Ampicillin]     Hives    Social History   Social History  . Marital status: Married    Spouse name: N/A  . Number of children: N/A  . Years of education: N/A   Occupational History  . Not on file.   Social History Main Topics  . Smoking status: Former Games developer  . Smokeless tobacco: Never Used     Comment:  stopped amoking 35 years ago  . Alcohol use No  . Drug use: No  . Sexual activity: Not on file   Other Topics Concern  . 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 Heart failure Mother   . Hypertension Mother   . Stroke Mother   . Rheumatic fever Brother        age 91 h/o w heart complications  . Hypertension Brother   . Stroke Brother   . Heart disease Brother   . COPD Sister   . Heart disease Other        many cousins deceased MI x2  . Hypertension Unknown        mothers side  . Diabetes Unknown        mothers side  . Lung cancer Unknown        paternal/maternal grandparents  . Liver disease Paternal Aunt   . Kidney failure Father   . Prostate cancer Neg Hx   . Colon cancer Neg Hx  Review of Systems  All other systems reviewed and are negative.      Objective:   Physical Exam  Constitutional: He is oriented to person, place, and time. He appears well-developed and well-nourished. No distress.  HENT:  Head: Normocephalic and atraumatic.  Right Ear: External ear normal.  Left Ear: External ear normal.  Nose: Nose normal.  Mouth/Throat: Oropharynx is clear and moist. No oropharyngeal exudate.  Eyes: Conjunctivae and EOM are normal. Pupils are equal, round, and reactive to light. Right eye exhibits no discharge. Left eye exhibits no discharge. No scleral icterus.  Neck: Normal range of motion. Neck supple. No JVD present. No tracheal deviation present. No thyromegaly present.  Cardiovascular: Normal rate, regular rhythm, normal heart sounds and intact distal pulses.  Exam reveals no gallop and no friction rub.   No murmur heard. Pulmonary/Chest: Effort normal and breath sounds normal. No stridor. No respiratory distress. He has no wheezes. He  has no rales. He exhibits no tenderness.  Abdominal: Soft. Bowel sounds are normal. He exhibits no distension and no mass. There is no tenderness. There is no rebound and no guarding.  Genitourinary: Rectum normal, prostate normal and penis normal.  Musculoskeletal: Normal range of motion. He exhibits no edema or tenderness.  Lymphadenopathy:    He has no cervical adenopathy.  Neurological: He is alert and oriented to person, place, and time. He has normal reflexes. No cranial nerve deficit. He exhibits normal muscle tone. Coordination normal.  Skin: Skin is warm. No rash noted. He is not diaphoretic. No erythema. No pallor.  Psychiatric: He has a normal mood and affect. His behavior is normal. Judgment and thought content normal.  Vitals reviewed.         Assessment & Plan:  Hypothyroidism, unspecified type  Prostate cancer screening  Routine general medical examination at a health care facility  Patient's physical exam today is completely normal. He received Prevnar 13.  Colonoscopy is due in 2020. Prostate exam is performed today and is normal. TSH is within therapeutic limits, I will recheck it again in 12 months. Hepatitis C screening has been performed. Immunizations are up-to-date. Regular anticipatory guidance is provided. I did recommend 10-15 pounds weight loss and asked the patient to monitor his blood pressure more closely.

## 2016-09-02 NOTE — Addendum Note (Signed)
Addended by: Legrand RamsWILLIS, Fabiha Rougeau B on: 09/02/2016 08:20 AM   Modules accepted: Orders

## 2017-06-17 ENCOUNTER — Other Ambulatory Visit: Payer: Self-pay | Admitting: Family Medicine

## 2017-06-17 MED ORDER — LEVOTHYROXINE SODIUM 50 MCG PO TABS: ORAL_TABLET | ORAL | 3 refills | Status: DC

## 2017-11-02 ENCOUNTER — Ambulatory Visit: Payer: 59 | Admitting: Family Medicine

## 2017-11-02 ENCOUNTER — Other Ambulatory Visit: Payer: Self-pay

## 2017-11-02 ENCOUNTER — Encounter: Payer: Self-pay | Admitting: Family Medicine

## 2017-11-02 VITALS — BP 142/68 | HR 72 | Temp 98.1°F | Resp 16 | Ht 75.0 in | Wt 240.0 lb

## 2017-11-02 DIAGNOSIS — L03115 Cellulitis of right lower limb: Secondary | ICD-10-CM | POA: Diagnosis not present

## 2017-11-02 MED ORDER — SULFAMETHOXAZOLE-TRIMETHOPRIM 800-160 MG PO TABS: 1.0000 | ORAL_TABLET | Freq: Two times a day (BID) | ORAL | 0 refills | Status: DC
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
## 2017-11-02 NOTE — Patient Instructions (Addendum)
Take medication as prescribed Keep area clean and dry  Schedule visit with Dr. Tanya NonesPickard ASAP

## 2017-11-25 ENCOUNTER — Other Ambulatory Visit: Payer: 59

## 2017-11-25 DIAGNOSIS — Z0000 Encounter for general adult medical examination without abnormal findings: Secondary | ICD-10-CM

## 2017-11-25 DIAGNOSIS — Z125 Encounter for screening for malignant neoplasm of prostate: Secondary | ICD-10-CM

## 2017-11-25 DIAGNOSIS — K219 Gastro-esophageal reflux disease without esophagitis: Secondary | ICD-10-CM

## 2017-11-25 DIAGNOSIS — E039 Hypothyroidism, unspecified: Secondary | ICD-10-CM

## 2017-11-26 LAB — COMPREHENSIVE METABOLIC PANEL
AG Ratio: 1.6 (calc) (ref 1.0–2.5)
ALT: 11 U/L (ref 9–46)
AST: 15 U/L (ref 10–35)
Albumin: 4.2 g/dL (ref 3.6–5.1)
Alkaline phosphatase (APISO): 86 U/L (ref 40–115)
BUN: 20 mg/dL (ref 7–25)
CHLORIDE: 107 mmol/L (ref 98–110)
CO2: 27 mmol/L (ref 20–32)
CREATININE: 1.11 mg/dL (ref 0.70–1.25)
Calcium: 8.9 mg/dL (ref 8.6–10.3)
GLOBULIN: 2.6 g/dL (ref 1.9–3.7)
Glucose, Bld: 107 mg/dL — ABNORMAL HIGH (ref 65–99)
Potassium: 4.2 mmol/L (ref 3.5–5.3)
SODIUM: 140 mmol/L (ref 135–146)
TOTAL PROTEIN: 6.8 g/dL (ref 6.1–8.1)
Total Bilirubin: 0.5 mg/dL (ref 0.2–1.2)

## 2017-11-26 LAB — PSA: PSA: 1.5 ng/mL (ref <=4.0)

## 2017-11-26 LAB — LIPID PANEL
Cholesterol: 154 mg/dL (ref <200)
HDL: 44 mg/dL (ref >40)
LDL Cholesterol (Calc): 96 mg/dL (calc)
NON-HDL CHOLESTEROL (CALC): 110 mg/dL (ref <130)
Total CHOL/HDL Ratio: 3.5 (calc) (ref <5.0)
Triglycerides: 54 mg/dL (ref <150)

## 2017-11-26 LAB — CBC WITH DIFFERENTIAL/PLATELET
BASOS PCT: 0.6 %
Basophils Absolute: 29 cells/uL (ref 0–200)
EOS ABS: 130 {cells}/uL (ref 15–500)
Eosinophils Relative: 2.7 %
HCT: 41.3 % (ref 38.5–50.0)
Hemoglobin: 13.5 g/dL (ref 13.2–17.1)
Lymphs Abs: 1046 cells/uL (ref 850–3900)
MCH: 30 pg (ref 27.0–33.0)
MCHC: 32.7 g/dL (ref 32.0–36.0)
MCV: 91.8 fL (ref 80.0–100.0)
MPV: 10.2 fL (ref 7.5–12.5)
Monocytes Relative: 12.4 %
NEUTROS PCT: 62.5 %
Neutro Abs: 3000 cells/uL (ref 1500–7800)
PLATELETS: 207 10*3/uL (ref 140–400)
RBC: 4.5 10*6/uL (ref 4.20–5.80)
RDW: 13.2 % (ref 11.0–15.0)
TOTAL LYMPHOCYTE: 21.8 %
WBC: 4.8 10*3/uL (ref 3.8–10.8)
WBCMIX: 595 {cells}/uL (ref 200–950)

## 2017-11-26 LAB — TSH: TSH: 1.62 mIU/L (ref 0.40–4.50)

## 2017-11-30 ENCOUNTER — Ambulatory Visit (INDEPENDENT_AMBULATORY_CARE_PROVIDER_SITE_OTHER): Payer: 59 | Admitting: Family Medicine

## 2017-11-30 ENCOUNTER — Encounter: Payer: Self-pay | Admitting: Family Medicine

## 2017-11-30 VITALS — BP 120/70 | HR 82 | Temp 97.7°F | Resp 16 | Ht 75.0 in | Wt 240.0 lb

## 2017-11-30 DIAGNOSIS — Z125 Encounter for screening for malignant neoplasm of prostate: Secondary | ICD-10-CM

## 2017-11-30 DIAGNOSIS — Z0000 Encounter for general adult medical examination without abnormal findings: Secondary | ICD-10-CM

## 2017-11-30 DIAGNOSIS — R012 Other cardiac sounds: Secondary | ICD-10-CM | POA: Diagnosis not present

## 2017-11-30 DIAGNOSIS — E039 Hypothyroidism, unspecified: Secondary | ICD-10-CM | POA: Diagnosis not present

## 2017-11-30 MED ORDER — SILVER SULFADIAZINE 1 % EX CREA: 1.0000 "application " | TOPICAL_CREAM | Freq: Every day | CUTANEOUS | 0 refills | Status: DC
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
## 2017-12-07 ENCOUNTER — Other Ambulatory Visit: Payer: Self-pay

## 2017-12-07 ENCOUNTER — Ambulatory Visit (HOSPITAL_COMMUNITY): Payer: 59 | Attending: Cardiovascular Disease

## 2017-12-07 DIAGNOSIS — R012 Other cardiac sounds: Secondary | ICD-10-CM | POA: Diagnosis not present

## 2018-04-17 ENCOUNTER — Other Ambulatory Visit: Payer: Self-pay | Admitting: Family Medicine

## 2018-07-02 ENCOUNTER — Emergency Department (HOSPITAL_COMMUNITY)
Admission: EM | Admit: 2018-07-02 | Discharge: 2018-07-02 | Disposition: A | Discharge status: Home / Self Care | Payer: 59 | Attending: Emergency Medicine | Admitting: Emergency Medicine

## 2018-07-02 ENCOUNTER — Other Ambulatory Visit: Payer: Self-pay

## 2018-07-02 ENCOUNTER — Encounter (HOSPITAL_COMMUNITY): Payer: Self-pay | Admitting: Emergency Medicine

## 2018-07-02 ENCOUNTER — Emergency Department (HOSPITAL_COMMUNITY): Payer: 59

## 2018-07-02 DIAGNOSIS — S61112A Laceration without foreign body of left thumb with damage to nail, initial encounter: Secondary | ICD-10-CM | POA: Diagnosis not present

## 2018-07-02 DIAGNOSIS — Y93H3 Activity, building and construction: Secondary | ICD-10-CM | POA: Insufficient documentation

## 2018-07-02 DIAGNOSIS — S6992XA Unspecified injury of left wrist, hand and finger(s), initial encounter: Secondary | ICD-10-CM | POA: Diagnosis present

## 2018-07-02 DIAGNOSIS — Y929 Unspecified place or not applicable: Secondary | ICD-10-CM | POA: Diagnosis not present

## 2018-07-02 DIAGNOSIS — E039 Hypothyroidism, unspecified: Secondary | ICD-10-CM | POA: Diagnosis not present

## 2018-07-02 DIAGNOSIS — W312XXA Contact with powered woodworking and forming machines, initial encounter: Secondary | ICD-10-CM | POA: Diagnosis not present

## 2018-07-02 DIAGNOSIS — S62525B Nondisplaced fracture of distal phalanx of left thumb, initial encounter for open fracture: Secondary | ICD-10-CM | POA: Insufficient documentation

## 2018-07-02 DIAGNOSIS — Y999 Unspecified external cause status: Secondary | ICD-10-CM | POA: Diagnosis not present

## 2018-07-02 DIAGNOSIS — J45909 Unspecified asthma, uncomplicated: Secondary | ICD-10-CM | POA: Insufficient documentation

## 2018-07-02 DIAGNOSIS — Z23 Encounter for immunization: Secondary | ICD-10-CM | POA: Insufficient documentation

## 2018-07-02 DIAGNOSIS — Z87891 Personal history of nicotine dependence: Secondary | ICD-10-CM | POA: Insufficient documentation

## 2018-07-02 MED ORDER — DOXYCYCLINE HYCLATE 100 MG PO TABS: 100.0000 mg | ORAL_TABLET | Freq: Two times a day (BID) | ORAL | 0 refills | Status: DC

## 2018-07-02 MED ORDER — POVIDONE-IODINE 10 % EX SOLN
CUTANEOUS | Status: AC
  Administered 2018-07-02: 1
  Filled 2018-07-02: qty 15

## 2018-07-02 MED ORDER — DOXYCYCLINE HYCLATE 100 MG PO TABS
100.0000 mg | ORAL_TABLET | Freq: Once | ORAL | Status: AC
  Administered 2018-07-02: 100 mg via ORAL
  Filled 2018-07-02: qty 1

## 2018-07-02 MED ORDER — TETANUS-DIPHTH-ACELL PERTUSSIS 5-2.5-18.5 LF-MCG/0.5 IM SUSP
0.5000 mL | Freq: Once | INTRAMUSCULAR | Status: AC
  Administered 2018-07-02: 0.5 mL via INTRAMUSCULAR
  Filled 2018-07-02: qty 0.5

## 2018-07-02 NOTE — Discharge Instructions (Signed)
Take the medications as prescribed for infection prevention.  The xray showed a possible small chip fracture.  Return for fever, worsening symptoms.  Consider following up with an hand orthopedic doctor to make sure the wound is healing properly

## 2018-07-02 NOTE — ED Triage Notes (Signed)
Per patient cut left thumb/fingertip with table saw. Bleeding controled at this time. Patient unsure of last tetanus vaccination.

## 2018-07-02 NOTE — ED Provider Notes (Signed)
[facility] EMERGENCY DEPARTMENT Provider Note   CSN: [id number] Arrival date & time: 07/02/18  1418    History   Chief Complaint Chief Complaint  Patient presents with  . Finger Injury    HPI Timothy Jimenez is a 68 y.o. male.     HPI Pt scented to the ED after an injury to his left thumb.  Patient was working with a table saw when he lacerated the distal aspect of his thumb.  Patient lacerated the dorsal aspect of his thumb involving the distal aspect of the thumbnail.  Bleeding has been controlled with pressure.  Patient is not sure what his last tetanus was.  He denies any other injuries. Past Medical History:  Diagnosis Date  . Allergic rhinitis 1999  . Asthma   . GERD (gastroesophageal reflux disease) 2002  . History of pneumonia 1997  . Hypothyroidism 2002  . 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 SBE (subacute bacterial endocarditis)    2nd to heart murmur (Dr Maple Hudson)  . Syncope    single episode 2012 thought to be vagal episode w/o return of symtpoms    Patient Active Problem List   Diagnosis Date Noted  . Advance care planning 11/27/2013  . Asthma 10/26/2011  . Foot pain 10/26/2011  . Rash 06/18/2011  . Routine general medical examination at a health care facility 09/18/2010  . Syncope 09/04/2010  . HEMORRHOIDS, INTERNAL THROMBOSED 08/16/2006  . Hypothyroidism 08/05/2006  . ALLERGIC RHINITIS 08/05/2006  . GERD 08/05/2006  . MIGRAINE HEADACHE 08/04/2006    Past Surgical History:  Procedure Laterality Date  . APPENDECTOMY     70 years old  . CARDIAC CATHETERIZATION    . COLONOSCOPY  02/25/2009   sm int and ext hemms (Dr. Ewing Schlein)   . ESOPHAGOGASTRODUODENOSCOPY     medium HH esophagitis ( Dr  Ewing Schlein) 02/25/2009  . TONSILLECTOMY     20 yoa        Home Medications    Prior to Admission medications   Medication Sig Start Date End Date Taking? Authorizing Provider  doxycycline (VIBRA-TABS) 100 MG tablet  Take 1 tablet (100 mg total) by mouth 2 (two) times daily. 07/02/18   Linwood Dibbles, MD  levothyroxine (SYNTHROID) 50 MCG tablet TAKE 1 TABLET DAILY 04/18/18   Donita Brooks, MD  Multiple Vitamin (MULTIVITAMIN) tablet Take 1 tablet by mouth daily.    [provider]  Probiotic Product (PROBIOTIC DAILY PO) Take by mouth.    [provider]  silver sulfADIAZINE (SILVADENE) 1 % cream Apply 1 application topically daily. 11/30/17   Donita Brooks, MD    Family History Family History  Problem Relation Age of Onset  . Heart failure Mother   . Hypertension Mother   . Stroke Mother   . Rheumatic fever Brother        age 16 h/o w heart complications  . Hypertension Brother   . Stroke Brother   . Heart disease Brother   . COPD Sister   . Kidney failure Father   . Heart disease Other        many cousins deceased MI x2  . Hypertension Other        mothers side  . Diabetes Other        mothers side  . Lung cancer Other        paternal/maternal grandparents  . Liver disease Paternal Aunt   . Prostate cancer Neg Hx   .  Colon cancer Neg Hx     Social History Social History   Tobacco Use  . Smoking status: Former Games developer  . Smokeless tobacco: Never Used  . Tobacco comment: stopped amoking 35 years ago  Substance Use Topics  . Alcohol use: No  . Drug use: No     Allergies   Polycillin [ampicillin]   Review of Systems Review of Systems  All other systems reviewed and are negative.    Physical Exam Updated Vital Signs BP (!) 144/83 (BP Location: Right Arm)   Pulse 83   Temp 98.2 F (36.8 C) (Oral)   Resp 16   Ht 1.905 m (6\' 3" )   Wt 108.9 kg   SpO2 96%   BMI 30.00 kg/m   Physical Exam Vitals signs and nursing note reviewed.  Constitutional:      General: He is not in acute distress.    Appearance: He is well-developed.  HENT:     Head: Normocephalic and atraumatic.     Right Ear: External ear normal.     Left Ear: External ear normal.  Eyes:      General: No scleral icterus.       Right eye: No discharge.        Left eye: No discharge.     Conjunctiva/sclera: Conjunctivae normal.  Neck:     Musculoskeletal: Neck supple.     Trachea: No tracheal deviation.  Cardiovascular:     Rate and Rhythm: Normal rate.  Pulmonary:     Effort: Pulmonary effort is normal. No respiratory distress.     Breath sounds: No stridor.  Abdominal:     General: There is no distension.  Musculoskeletal:        General: No swelling or deformity.     Comments:  laceration from the the distal third of the patient's fingernail to the tip of the finger,  laceration does not appear to involve the bone, the distal third of the fingernail and tissue is completely avulsed off,  Skin:    General: Skin is warm and dry.     Findings: No rash.  Neurological:     Mental Status: He is alert.     Cranial Nerves: Cranial nerve deficit: no gross deficits.      ED Treatments / Results  Labs (all labs ordered are listed, but only abnormal results are displayed) Labs Reviewed - No data to display  EKG None  Radiology Dg Finger Thumb Left  Result Date: 07/02/2018 CLINICAL DATA:  Injury to left first finger with table saw. EXAM: LEFT THUMB 2+V COMPARISON:  None. FINDINGS: There may be very subtle injury/disruption at the tip of the tuft of the distal phalanx near the level of soft tissue injury without soft tissue foreign body. 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 IMPRESSION: Subtle irregularity of bone at the tuft of the distal phalanx may be consistent with very subtle bony injury. No evidence of soft tissue foreign body. Electronically Signed   By: Irish Lack M.D.   On: 07/02/2018 15:03    Procedures Procedures (including critical care time)  Medications Ordered in ED Medications  povidone-iodine (BETADINE) 10 % external solution (has no administration in time range)  doxycycline (VIBRA-TABS) tablet  100 mg (has no administration in time range)  Tdap (BOOSTRIX) injection 0.5 mL (0.5 mLs Intramuscular Given 07/02/18 1442)     Initial Impression / Assessment and Plan / ED Course  I have reviewed the triage  vital signs and the nursing notes.  Pertinent labs & imaging results that were available during my care of the patient were reviewed by me and considered in my medical decision making (see chart for details).   Patient's x-ray does show the possibility of a small fracture involving the distal tuft of the distal phalanx.  Patient's laceration is not amenable to any suturing.  He completely avulsed off the tissue.  On visual inspection I do not see any obvious bone at the base of his wound.  Plan on covering him with antibiotics.  Outpatient follow-up with orthopedics  Final Clinical Impressions(s) / ED Diagnoses   Final diagnoses:  Open nondisplaced fracture of distal phalanx of left thumb, initial encounter    ED Discharge Orders         Ordered    doxycycline (VIBRA-TABS) 100 MG tablet  2 times daily     07/02/18 1517           Linwood Dibbles, MD 07/02/18 [phone]

## 2018-08-30 ENCOUNTER — Other Ambulatory Visit: Payer: Self-pay | Admitting: Family Medicine

## 2018-12-13 ENCOUNTER — Encounter: Payer: Self-pay | Admitting: Family Medicine

## 2018-12-13 ENCOUNTER — Ambulatory Visit (INDEPENDENT_AMBULATORY_CARE_PROVIDER_SITE_OTHER): Payer: 59 | Admitting: Family Medicine

## 2018-12-13 ENCOUNTER — Other Ambulatory Visit: Payer: Self-pay

## 2018-12-13 VITALS — BP 120/62 | HR 69 | Temp 97.6°F | Resp 14 | Ht 75.0 in | Wt 249.0 lb

## 2018-12-13 DIAGNOSIS — L03115 Cellulitis of right lower limb: Secondary | ICD-10-CM | POA: Diagnosis not present

## 2018-12-13 MED ORDER — SILVER SULFADIAZINE 1 % EX CREA: 1.0000 "application " | TOPICAL_CREAM | Freq: Every day | CUTANEOUS | 0 refills | Status: DC

## 2018-12-13 MED ORDER — DOXYCYCLINE HYCLATE 100 MG PO TABS: 100.0000 mg | ORAL_TABLET | Freq: Two times a day (BID) | ORAL | 0 refills | Status: DC
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
## 2018-12-23 ENCOUNTER — Other Ambulatory Visit: Payer: Self-pay

## 2018-12-23 ENCOUNTER — Ambulatory Visit: Payer: 59 | Admitting: Family Medicine

## 2018-12-23 ENCOUNTER — Encounter: Payer: Self-pay | Admitting: Family Medicine

## 2018-12-23 ENCOUNTER — Other Ambulatory Visit: Payer: Self-pay | Admitting: Family Medicine

## 2018-12-23 VITALS — BP 130/92 | HR 66 | Temp 98.2°F | Resp 16 | Ht 75.0 in | Wt 249.0 lb

## 2018-12-23 DIAGNOSIS — S81801S Unspecified open wound, right lower leg, sequela: Secondary | ICD-10-CM

## 2018-12-23 DIAGNOSIS — L03115 Cellulitis of right lower limb: Secondary | ICD-10-CM | POA: Diagnosis not present
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
## 2019-01-03 ENCOUNTER — Telehealth: Payer: Self-pay

## 2019-01-03 NOTE — Telephone Encounter (Signed)

## 2019-01-04 ENCOUNTER — Other Ambulatory Visit: Payer: Self-pay

## 2019-01-04 ENCOUNTER — Ambulatory Visit: Payer: 59 | Admitting: Plastic Surgery

## 2019-01-04 ENCOUNTER — Encounter: Payer: Self-pay | Admitting: Plastic Surgery

## 2019-01-04 DIAGNOSIS — S81801A Unspecified open wound, right lower leg, initial encounter: Secondary | ICD-10-CM
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
## 2019-01-05 ENCOUNTER — Telehealth: Payer: Self-pay | Admitting: *Deleted

## 2019-01-05 NOTE — Telephone Encounter (Signed)
Faxed order to [facility] on (01/04/19).  Confirmation received.//AB/CMA

## 2019-02-15 ENCOUNTER — Ambulatory Visit: Payer: 59 | Admitting: Plastic Surgery

## 2019-02-15 ENCOUNTER — Encounter: Payer: Self-pay | Admitting: Plastic Surgery

## 2019-02-15 ENCOUNTER — Other Ambulatory Visit: Payer: Self-pay

## 2019-02-15 VITALS — BP 142/76 | HR 71 | Temp 97.8°F | Ht 75.0 in | Wt 248.6 lb

## 2019-02-15 DIAGNOSIS — S81801A Unspecified open wound, right lower leg, initial encounter: Secondary | ICD-10-CM | POA: Diagnosis not present
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
## 2019-02-16 ENCOUNTER — Ambulatory Visit (INDEPENDENT_AMBULATORY_CARE_PROVIDER_SITE_OTHER): Payer: 59

## 2019-02-16 DIAGNOSIS — Z23 Encounter for immunization: Secondary | ICD-10-CM | POA: Diagnosis not present

## 2019-02-20 ENCOUNTER — Other Ambulatory Visit: Payer: Self-pay

## 2019-02-20 ENCOUNTER — Other Ambulatory Visit (HOSPITAL_COMMUNITY)
Admission: RE | Admit: 2019-02-20 | Discharge: 2019-02-20 | Disposition: A | Discharge status: Home / Self Care | Payer: 59 | Source: Ambulatory Visit | Attending: Plastic Surgery | Admitting: Plastic Surgery

## 2019-02-20 ENCOUNTER — Ambulatory Visit: Payer: 59 | Admitting: Plastic Surgery

## 2019-02-20 VITALS — BP 143/85 | HR 74 | Temp 98.8°F

## 2019-02-20 DIAGNOSIS — S81801A Unspecified open wound, right lower leg, initial encounter: Secondary | ICD-10-CM
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
## 2019-02-23 ENCOUNTER — Other Ambulatory Visit: Payer: Self-pay | Admitting: Pulmonary Disease

## 2019-02-23 LAB — SURGICAL PATHOLOGY

## 2019-03-09 ENCOUNTER — Ambulatory Visit: Payer: 59 | Admitting: Plastic Surgery

## 2019-03-09 ENCOUNTER — Encounter: Payer: Self-pay | Admitting: Plastic Surgery

## 2019-03-09 ENCOUNTER — Other Ambulatory Visit: Payer: Self-pay

## 2019-03-09 VITALS — BP 145/81 | HR 64 | Temp 97.7°F | Ht 75.0 in | Wt 251.4 lb

## 2019-03-09 DIAGNOSIS — S81801A Unspecified open wound, right lower leg, initial encounter: Secondary | ICD-10-CM
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
## 2019-03-23 ENCOUNTER — Other Ambulatory Visit: Payer: Self-pay | Admitting: Family Medicine

## 2019-09-22 ENCOUNTER — Other Ambulatory Visit: Payer: Self-pay | Admitting: Family Medicine

## 2020-02-26 ENCOUNTER — Other Ambulatory Visit: Payer: Self-pay

## 2020-02-26 MED ORDER — LEVOTHYROXINE SODIUM 50 MCG PO TABS: ORAL_TABLET | ORAL | 0 refills | Status: DC

## 2020-03-13 ENCOUNTER — Other Ambulatory Visit: Payer: Self-pay

## 2020-03-13 MED ORDER — LEVOTHYROXINE SODIUM 50 MCG PO TABS: ORAL_TABLET | ORAL | 2 refills | Status: DC

## 2020-07-22 ENCOUNTER — Encounter (INDEPENDENT_AMBULATORY_CARE_PROVIDER_SITE_OTHER): Payer: Self-pay | Admitting: Otolaryngology

## 2020-07-22 ENCOUNTER — Other Ambulatory Visit: Payer: Self-pay

## 2020-07-22 ENCOUNTER — Ambulatory Visit (INDEPENDENT_AMBULATORY_CARE_PROVIDER_SITE_OTHER): Payer: Medicare Other | Admitting: Otolaryngology

## 2020-07-22 VITALS — Temp 97.3°F

## 2020-07-22 DIAGNOSIS — R1319 Other dysphagia: Secondary | ICD-10-CM

## 2020-07-22 DIAGNOSIS — K219 Gastro-esophageal reflux disease without esophagitis: Secondary | ICD-10-CM
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
## 2020-08-14 ENCOUNTER — Other Ambulatory Visit: Payer: Self-pay | Admitting: Family Medicine

## 2020-08-26 ENCOUNTER — Encounter: Payer: Self-pay | Admitting: Family Medicine

## 2020-08-26 ENCOUNTER — Ambulatory Visit (INDEPENDENT_AMBULATORY_CARE_PROVIDER_SITE_OTHER): Payer: Medicare Other | Admitting: Family Medicine

## 2020-08-26 ENCOUNTER — Other Ambulatory Visit: Payer: Self-pay

## 2020-08-26 VITALS — BP 132/78 | HR 84 | Temp 97.9°F | Resp 16 | Ht 75.0 in | Wt 251.0 lb

## 2020-08-26 DIAGNOSIS — Z125 Encounter for screening for malignant neoplasm of prostate: Secondary | ICD-10-CM

## 2020-08-26 DIAGNOSIS — E039 Hypothyroidism, unspecified: Secondary | ICD-10-CM

## 2020-08-26 LAB — CBC WITH DIFFERENTIAL/PLATELET
Basophils Absolute: 47 cells/uL (ref 0–200)
Basophils Relative: 0.7 %
Eosinophils Relative: 2.2 %
Total Lymphocyte: 17.4 %

## 2020-08-26 LAB — COMPLETE METABOLIC PANEL WITH GFR: Potassium: 3.8 mmol/L (ref 3.5–5.3)

## 2020-08-26 MED ORDER — LEVOTHYROXINE SODIUM 50 MCG PO TABS: ORAL_TABLET | ORAL | 1 refills | Status: DC

## 2020-08-26 MED ORDER — PANTOPRAZOLE SODIUM 40 MG PO TBEC: 40.0000 mg | DELAYED_RELEASE_TABLET | Freq: Every day | ORAL | 1 refills | Status: DC

## 2020-08-26 NOTE — Progress Notes (Signed)
Subjective:    Patient ID: Timothy Jimenez, male    DOB: 24-Feb-1951, 70 y.o.   MRN: [id number]  HPI Patient has not been seen in quite some time.  He has a history of hypothyroidism and he is overdue to recheck a TSH.  He is currently taking levothyroxine 50 mcg a day.  He denies any fatigue.  He denies any weight change.  He denies any chest pain shortness of breath or dyspnea on exertion.  Blood pressure today is well controlled at 132/78.  He does benefit from the Protonix.  Without the Protonix he has indigestion.  He is due for prostate cancer screening with a PSA.  Recently had a colonoscopy performed by Dr. Janae Bridgeman guide.  Per the patient's report it was clear Past Medical History:  Diagnosis Date  . Allergic rhinitis 1999  . Asthma   . GERD (gastroesophageal reflux disease) 2002  . History of pneumonia 1997  . Hypothyroidism 2002  . 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 SBE (subacute bacterial endocarditis)    2nd to heart murmur (Dr Maple Hudson)  . Syncope    single episode 2012 thought to be vagal episode w/o return of symtpoms   Past Surgical History:  Procedure Laterality Date  . APPENDECTOMY     58 years old  . CARDIAC CATHETERIZATION    . COLONOSCOPY  02/25/2009   sm int and ext hemms (Dr. Ewing Schlein)   . ESOPHAGOGASTRODUODENOSCOPY     medium HH esophagitis ( Dr  Ewing Schlein) 02/25/2009  . TONSILLECTOMY     20 yoa   No current outpatient medications on file prior to visit.   No current facility-administered medications on file prior to visit.   Patient is on levothyroxine 50 mcg daily as well as Protonix 40 mg daily Allergies  Allergen Reactions  . Polycillin [Ampicillin]     Hives    Social History   Socioeconomic History  . Marital status: Married    Spouse name: Not on file  . Number of children: Not on file  . Years of education: Not on file  . Highest education level: Not on file  Occupational History  . Not on file   Tobacco Use  . Smoking status: Former Smoker    Packs/day: 0.50    Years: 3.00    Pack years: 1.50    Quit date: 1977    Years since quitting: 45.4  . Smokeless tobacco: Never Used  . Tobacco comment: stopped amoking 35 years ago  Vaping Use  . Vaping Use: Never used  Substance and Sexual Activity  . Alcohol use: No  . Drug use: No  . Sexual activity: Not on file  Other Topics Concern  . 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 Heart failure Mother   . Hypertension Mother   . Stroke Mother   . Rheumatic fever Brother  age 51 h/o w heart complications  . Hypertension Brother   . Stroke Brother   . Heart disease Brother   . COPD Sister   . Kidney failure Father   . Heart disease Other        many cousins deceased MI x2  . Hypertension Other        mothers side  . Diabetes Other        mothers side  . Lung cancer Other        paternal/maternal grandparents  . Liver disease Paternal Aunt   . Prostate cancer Neg Hx   . Colon cancer Neg Hx       Review of Systems  All other systems reviewed and are negative.      Objective:   Physical Exam Vitals reviewed.  Constitutional:      General: He is not in acute distress.    Appearance: He is well-developed. He is not diaphoretic.  HENT:     Head: Normocephalic and atraumatic.     Right Ear: External ear normal.     Left Ear: External ear normal.     Nose: Nose normal.     Mouth/Throat:     Pharynx: No oropharyngeal  exudate.  Eyes:     General: No scleral icterus.       Right eye: No discharge.        Left eye: No discharge.     Conjunctiva/sclera: Conjunctivae normal.     Pupils: Pupils are equal, round, and reactive to light.  Neck:     Thyroid: No thyromegaly.     Vascular: No JVD.     Trachea: No tracheal deviation.  Cardiovascular:     Rate and Rhythm: Normal rate and regular rhythm.     Heart sounds: Normal heart sounds. No murmur heard. No friction rub. No gallop.   Pulmonary:     Effort: Pulmonary effort is normal. No respiratory distress.     Breath sounds: Normal breath sounds. No stridor. No wheezing or rales.  Chest:     Chest wall: No tenderness.  Abdominal:     General: Bowel sounds are normal. There is no distension.     Palpations: Abdomen is soft. There is no mass.     Tenderness: There is no abdominal tenderness. There is no guarding or rebound.  Genitourinary:    Penis: Normal.      Prostate: Normal.     Rectum: Normal.  Musculoskeletal:        General: No tenderness. Normal range of motion.     Cervical back: Normal range of motion and neck supple.  Lymphadenopathy:     Cervical: No cervical adenopathy.  Skin:    General: Skin is warm.     Coloration: Skin is not pale.     Findings: No erythema or rash.  Neurological:     Mental Status: He is alert and oriented to person, place, and time.     Cranial Nerves: No cranial nerve deficit.     Motor: No abnormal muscle tone.     Coordination: Coordination normal.     Deep Tendon Reflexes: Reflexes are normal and symmetric.  Psychiatric:        Behavior: Behavior normal.        Thought Content: Thought content normal.        Judgment: Judgment normal.           Assessment & Plan:  Hypothyroidism, unspecified type - Plan: CBC with Differential/Platelet,  COMPLETE METABOLIC PANEL WITH GFR, TSH  Prostate cancer screening - Plan: PSA  .  Check CBC, CMP, TSH.  Ensure levothyroxine is appropriately dosed by  maintaining TSH within the therapeutic range.  While checking lab work check a PSA to screen for prostate cancer.  Patient was bitten on the back of his left calf by a tick.  There is a 1 cm purple papule where the tick bit him.  It appears as though the entire tick has been removed.  I do not see any visible material remaining in the bite wound.  There is no evidence of erythema migrans.  I explained to the patient the symptoms to watch for.  If he develops a fever or headaches or body aches he is to call me immediately.  If he develops a spreading red ring he is to call me immediately.  Otherwise no treatment is necessary at this time

## 2020-08-27 ENCOUNTER — Encounter: Payer: Self-pay | Admitting: *Deleted

## 2020-08-27 LAB — CBC WITH DIFFERENTIAL/PLATELET
Absolute Monocytes: 650 cells/uL (ref 200–950)
Eosinophils Absolute: 147 cells/uL (ref 15–500)
HCT: 41.3 % (ref 38.5–50.0)
Hemoglobin: 13.9 g/dL (ref 13.2–17.1)
Lymphs Abs: 1166 cells/uL (ref 850–3900)
MCH: 30.7 pg (ref 27.0–33.0)
MCHC: 33.7 g/dL (ref 32.0–36.0)
MCV: 91.2 fL (ref 80.0–100.0)
MPV: 10.1 fL (ref 7.5–12.5)
Monocytes Relative: 9.7 %
Neutro Abs: 4690 cells/uL (ref 1500–7800)
Neutrophils Relative %: 70 %
Platelets: 232 10*3/uL (ref 140–400)
RBC: 4.53 10*6/uL (ref 4.20–5.80)
RDW: 13.1 % (ref 11.0–15.0)
WBC: 6.7 10*3/uL (ref 3.8–10.8)

## 2020-08-27 LAB — COMPLETE METABOLIC PANEL WITH GFR
AG Ratio: 1.8 (calc) (ref 1.0–2.5)
ALT: 15 U/L (ref 9–46)
AST: 20 U/L (ref 10–35)
Albumin: 4.6 g/dL (ref 3.6–5.1)
Alkaline phosphatase (APISO): 89 U/L (ref 35–144)
BUN/Creatinine Ratio: 13 (calc) (ref 6–22)
BUN: 15 mg/dL (ref 7–25)
CO2: 25 mmol/L (ref 20–32)
Calcium: 9.4 mg/dL (ref 8.6–10.3)
Chloride: 104 mmol/L (ref 98–110)
Creat: 1.19 mg/dL — ABNORMAL HIGH (ref 0.70–1.18)
GFR, Est African American: 71 mL/min/{1.73_m2} (ref >=60)
GFR, Est Non African American: 62 mL/min/{1.73_m2} (ref >=60)
Globulin: 2.6 g/dL (calc) (ref 1.9–3.7)
Glucose, Bld: 108 mg/dL — ABNORMAL HIGH (ref 65–99)
Sodium: 139 mmol/L (ref 135–146)
Total Bilirubin: 0.6 mg/dL (ref 0.2–1.2)
Total Protein: 7.2 g/dL (ref 6.1–8.1)

## 2020-08-27 LAB — PSA: PSA: 1.28 ng/mL (ref <=4.00)

## 2020-08-27 LAB — TSH: TSH: 3.09 mIU/L (ref 0.40–4.50)

## 2020-09-17 ENCOUNTER — Other Ambulatory Visit: Payer: Self-pay | Admitting: Family Medicine

## 2020-09-27 ENCOUNTER — Telehealth: Payer: Self-pay | Admitting: Family Medicine

## 2020-09-27 NOTE — Telephone Encounter (Signed)
Pt called in concerning a bill that he received, this is the info taken from him  DOS: 09/17/2020 AMT OF BILL: 17.66 INSURANCE: MEDICARE A AND B, [employer] CONTACT [phone]

## 2020-10-01 NOTE — Telephone Encounter (Signed)
Left vm for patient.  I have looked at his account and it looks like his secondary insurance has paid the outstanding balance of $17.66 and he should disregard bill.   CB# [phone]

## 2020-12-16 ENCOUNTER — Other Ambulatory Visit: Payer: Medicare Other

## 2020-12-16 ENCOUNTER — Other Ambulatory Visit: Payer: Self-pay

## 2020-12-16 DIAGNOSIS — Z136 Encounter for screening for cardiovascular disorders: Secondary | ICD-10-CM

## 2020-12-16 DIAGNOSIS — Z13220 Encounter for screening for lipoid disorders: Secondary | ICD-10-CM

## 2020-12-16 DIAGNOSIS — E039 Hypothyroidism, unspecified: Secondary | ICD-10-CM

## 2020-12-17 LAB — COMPLETE METABOLIC PANEL WITH GFR
AG Ratio: 1.7 (calc) (ref 1.0–2.5)
ALT: 14 U/L (ref 9–46)
AST: 16 U/L (ref 10–35)
Albumin: 4.2 g/dL (ref 3.6–5.1)
Alkaline phosphatase (APISO): 87 U/L (ref 35–144)
BUN: 15 mg/dL (ref 7–25)
CO2: 27 mmol/L (ref 20–32)
Calcium: 9.2 mg/dL (ref 8.6–10.3)
Chloride: 106 mmol/L (ref 98–110)
Creat: 1.06 mg/dL (ref 0.70–1.28)
Globulin: 2.5 g/dL (calc) (ref 1.9–3.7)
Glucose, Bld: 114 mg/dL — ABNORMAL HIGH (ref 65–99)
Potassium: 4.5 mmol/L (ref 3.5–5.3)
Sodium: 141 mmol/L (ref 135–146)
Total Bilirubin: 0.3 mg/dL (ref 0.2–1.2)
Total Protein: 6.7 g/dL (ref 6.1–8.1)
eGFR: 75 mL/min/{1.73_m2} (ref >=60)

## 2020-12-17 LAB — LIPID PANEL
Cholesterol: 138 mg/dL (ref <200)
HDL: 36 mg/dL — ABNORMAL LOW (ref >=40)
LDL Cholesterol (Calc): 83 mg/dL (calc)
Non-HDL Cholesterol (Calc): 102 mg/dL (calc) (ref <130)
Total CHOL/HDL Ratio: 3.8 (calc) (ref <5.0)
Triglycerides: 96 mg/dL (ref <150)

## 2020-12-17 LAB — CBC WITH DIFFERENTIAL/PLATELET
Absolute Monocytes: 685 cells/uL (ref 200–950)
Basophils Absolute: 28 cells/uL (ref 0–200)
Basophils Relative: 0.6 %
Eosinophils Absolute: 138 cells/uL (ref 15–500)
Eosinophils Relative: 3 %
HCT: 43 % (ref 38.5–50.0)
Hemoglobin: 13.9 g/dL (ref 13.2–17.1)
Lymphs Abs: 1003 cells/uL (ref 850–3900)
MCH: 29.3 pg (ref 27.0–33.0)
MCHC: 32.3 g/dL (ref 32.0–36.0)
MCV: 90.7 fL (ref 80.0–100.0)
MPV: 10.2 fL (ref 7.5–12.5)
Monocytes Relative: 14.9 %
Neutro Abs: 2746 cells/uL (ref 1500–7800)
Neutrophils Relative %: 59.7 %
Platelets: 200 10*3/uL (ref 140–400)
RBC: 4.74 10*6/uL (ref 4.20–5.80)
RDW: 12.6 % (ref 11.0–15.0)
Total Lymphocyte: 21.8 %
WBC: 4.6 10*3/uL (ref 3.8–10.8)

## 2020-12-17 LAB — TSH: TSH: <0.01 mIU/L — ABNORMAL LOW (ref 0.40–4.50)

## 2020-12-20 ENCOUNTER — Encounter: Payer: Self-pay | Admitting: *Deleted

## 2020-12-25 ENCOUNTER — Telehealth: Payer: Self-pay | Admitting: *Deleted

## 2020-12-25 ENCOUNTER — Other Ambulatory Visit: Payer: Self-pay | Admitting: *Deleted

## 2020-12-25 DIAGNOSIS — E039 Hypothyroidism, unspecified: Secondary | ICD-10-CM

## 2020-12-25 NOTE — Telephone Encounter (Signed)
Received call from patient.   Inquired about recent results.  Thyroid test suggest that he is taking way too much levothyroxine.  Verify the dose that he is taking as this is completely different from his result 3 months ago.  I would hold the levothyroxine and recheck a TSH in 6 weeks   Patient made aware and verbalized understanding.   Future lab orders placed.

## 2021-01-27 ENCOUNTER — Other Ambulatory Visit: Payer: Self-pay | Admitting: Family Medicine

## 2021-02-17 ENCOUNTER — Other Ambulatory Visit: Payer: Medicare Other

## 2021-02-17 ENCOUNTER — Other Ambulatory Visit: Payer: Self-pay

## 2021-02-17 DIAGNOSIS — E039 Hypothyroidism, unspecified: Secondary | ICD-10-CM

## 2021-02-18 LAB — TSH: TSH: 0.09 mIU/L — ABNORMAL LOW (ref 0.40–4.50)

## 2021-02-18 LAB — T4, FREE: Free T4: 1 ng/dL (ref 0.8–1.8)

## 2021-02-18 LAB — T3, FREE: T3, Free: 3.9 pg/mL (ref 2.3–4.2)

## 2021-04-21 ENCOUNTER — Other Ambulatory Visit: Payer: Self-pay

## 2021-04-21 ENCOUNTER — Other Ambulatory Visit: Payer: Medicare Other

## 2021-04-21 DIAGNOSIS — E039 Hypothyroidism, unspecified: Secondary | ICD-10-CM

## 2021-04-21 LAB — TSH: TSH: <0.01 mIU/L — ABNORMAL LOW (ref 0.40–4.50)

## 2021-04-22 ENCOUNTER — Telehealth: Payer: Self-pay

## 2021-04-22 NOTE — Telephone Encounter (Signed)
Left message for patient to call back regarding results and recommendations. °

## 2021-04-22 NOTE — Telephone Encounter (Signed)
-----   Message from Susy Frizzle, MD sent at 04/22/2021  6:40 AM EST ----- Still on too much thyroid medication, stop levothyroxine and recheck in 6 weeks.

## 2021-05-16 ENCOUNTER — Other Ambulatory Visit: Payer: Self-pay | Admitting: Family Medicine

## 2021-06-05 ENCOUNTER — Ambulatory Visit (INDEPENDENT_AMBULATORY_CARE_PROVIDER_SITE_OTHER): Payer: Medicare Other

## 2021-06-05 ENCOUNTER — Other Ambulatory Visit: Payer: Medicare Other

## 2021-06-05 ENCOUNTER — Other Ambulatory Visit: Payer: Self-pay

## 2021-06-05 VITALS — BP 130/74 | HR 68 | Ht 75.0 in | Wt 245.8 lb

## 2021-06-05 DIAGNOSIS — Z0000 Encounter for general adult medical examination without abnormal findings: Secondary | ICD-10-CM | POA: Diagnosis not present

## 2021-06-05 NOTE — Progress Notes (Signed)
Subjective:   Timothy Jimenez is a 71 y.o. male who presents for an Initial Medicare Annual Wellness Visit.  Review of Systems     Cardiac Risk Factors include: advanced age (>73men, >65 women);male gender;sedentary lifestyle;obesity (BMI >30kg/m2);Other (see comment), Risk factor comments: Thyroid dz,     Objective:    Today's Vitals   06/05/21 0811  BP: 130/74  Pulse: 68  SpO2: 99%  Weight: 245 lb 12.8 oz (111.5 kg)  Height: 6\' 3"  (1.905 m)   Body mass index is 30.72 kg/m.  Advanced Directives 06/05/2021 07/02/2018  Does Patient Have a Medical Advance Directive? No No  Would patient like information on creating a medical advance directive? No - Patient declined -    Current Medications (verified) Outpatient Encounter Medications as of 06/05/2021  Medication Sig   doxycycline (VIBRAMYCIN) 100 MG capsule Take 100 mg by mouth 2 (two) times daily.   metroNIDAZOLE (METROCREAM) 0.75 % cream Apply topically daily.   pantoprazole (PROTONIX) 40 MG tablet TAKE 1 TABLET DAILY   SSD 1 % cream APPLY CREAM TOPICALLY ONCE DAILY   [DISCONTINUED] levothyroxine (SYNTHROID) 50 MCG tablet TAKE 1 TABLET DAILY   No facility-administered encounter medications on file as of 06/05/2021.    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 Watt Climes)    ESOPHAGOGASTRODUODENOSCOPY     medium HH esophagitis ( Dr   Watt Climes) 02/25/2009   TONSILLECTOMY     16 yoa   Family History  Problem Relation Age of Onset   Heart failure Mother    Hypertension Mother    Stroke Mother    Rheumatic fever Brother        age 58 h/o w heart complications   Hypertension Brother    Stroke Brother    Heart disease Brother    COPD Sister    Kidney failure Father    Heart disease Other        many cousins deceased MI x2   Hypertension Other        mothers side   Diabetes Other        mothers side   Lung cancer Other        paternal/maternal grandparents   Liver disease Paternal Aunt    Prostate cancer Neg Hx    Colon cancer Neg Hx    Social History   Socioeconomic History   Marital status: Married    Spouse name: Timothy Jimenez   Number of children: 2   Years of education: Not on file   Highest education level: Not on file  Occupational History   Not on file  Tobacco Use   Smoking status: Former    Packs/day: 0.50    Years: 3.00    Pack years: 1.50    Types: Cigarettes    Quit date: 1977  Years since quitting: 46.1   Smokeless tobacco: Never   Tobacco comments:    stopped amoking 35 years ago  Vaping Use   Vaping Use: Never used  Substance and Sexual Activity   Alcohol use: No   Drug use: No   Sexual activity: Yes  Other Topics Concern   Not on file  Social History Narrative   Marital Status: Married Timothy Jimenez: Timothy Jimenez   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   Social Determinants of Health   Financial Resource Strain: Low Risk    Difficulty of Paying Living Expenses: Not hard at all  Food Insecurity: No Food Insecurity   Worried About Charity fundraiser in the Last Year: Never true   Arboriculturist in the Last Year: Never true  Transportation Needs: No Transportation Needs   Lack of Transportation (Medical): No   Lack of Transportation (Non-Medical): No  Physical Activity: Insufficiently Active    Days of Exercise per Week: 5 days   Minutes of Exercise per Session: 20 min  Stress: No Stress Concern Present   Feeling of Stress : Not at all  Social Connections: Socially Integrated   Frequency of Communication with Friends and Family: More than three times a week   Frequency of Social Gatherings with Friends and Family: More than three times a week   Attends Religious Services: More than 4 times per year   Active Member of [employer] or Organizations: Yes   Attends Music therapist: More than 4 times per year   Marital Status: Married    Tobacco Counseling Counseling given: Not Answered Tobacco comments: stopped amoking 35 years ago   Clinical Intake:  Pre-visit preparation completed: Yes  Pain : No/denies pain     BMI - recorded: 30.72 Nutritional Status: BMI > 30  Obese Nutritional Risks: None Diabetes: No     Diabetic?no  Interpreter Needed?: No  Information entered by :: Timothy Meshach Perry, lpn   Activities of Daily Living In your present state of health, do you have any difficulty performing the following activities: 06/05/2021  Hearing? N  Vision? N  Difficulty concentrating or making decisions? Y  Comment Concentration  Walking or climbing stairs? N  Dressing or bathing? N  Doing errands, shopping? N  Preparing Food and eating ? N  Using the Toilet? N  In the past six months, have you accidently leaked urine? N  Do you have problems with loss of bowel control? N  Managing your Medications? N  Managing your Finances? N  Housekeeping or managing your Housekeeping? N  Some recent data might be hidden    Patient Care Team: Timothy Frizzle, MD as PCP - General (Family Medicine)  Indicate any recent Medical Services you may have received from other than [facility] providers in the past year (date may be approximate).     Assessment:   This is a routine wellness examination for Timothy Jimenez.  Hearing/Vision screen Hearing Screening - Comments:: No hearing  issues.  Vision Screening - Comments:: Readers. Dr. [name] in [place]. 2022  Dietary issues and exercise activities discussed: Current Exercise Habits: Home exercise routine, Type of exercise: walking, Time (Minutes): 20, Frequency (Times/Week): 5, Weekly Exercise (Minutes/Week): 100, Intensity: Mild, Exercise limited by: Other - see comments (Thyroid dz, syncope)   Goals Addressed             This Visit's Progress  Exercise 150 min/wk Moderate Activity       Increase exercise. Plant a garden.       Depression Screen PHQ 2/9 Scores 06/05/2021 08/26/2020 11/30/2017 11/30/2017 11/02/2017 09/01/2016  PHQ - 2 Score 0 0 0 0 0 0    Fall Risk Fall Risk  06/05/2021 08/26/2020 11/30/2017 11/30/2017 11/02/2017  Falls in the past year? 1 0 No No No  Number falls in past yr: 0 - - - -  Injury with Fall? 0 - - - -  Risk for fall due to : History of fall(s) No Fall Risks - - -  Follow up Falls prevention discussed Falls evaluation completed - - -    FALL RISK PREVENTION PERTAINING TO THE HOME:  Any stairs in or around the home? No  If so, are there any without handrails? No  Home free of loose throw rugs in walkways, pet beds, electrical cords, etc? Yes  Adequate lighting in your home to reduce risk of falls? Yes   ASSISTIVE DEVICES UTILIZED TO PREVENT FALLS:  Life alert? No  Use of a cane, walker or w/c? No  Grab bars in the bathroom? No  Shower chair or bench in shower? No  Elevated toilet seat or a handicapped toilet? Yes   TIMED UP AND GO:  Was the test performed? Yes .  Length of time to ambulate 10 feet: 8 sec.   Gait steady and fast without use of assistive device  Cognitive Function:     6CIT Screen 06/05/2021  What Year? 0 points  What month? 0 points  What time? 0 points  Count back from 20 0 points  Months in reverse 0 points  Repeat phrase 0 points  Total Score 0    Immunizations Immunization History  Administered Date(s) Administered   Fluad Quad(high Dose  65+) 02/16/2019   Influenza, High Dose Seasonal PF 01/07/2018   Influenza-Unspecified 02/18/2021   PFIZER(Purple Top)SARS-COV-2 Vaccination 11/17/2019, 12/08/2019   Pfizer Covid-19 Vaccine Bivalent Booster 40yrs & up 05/10/2020   Pneumococcal Conjugate-13 09/01/2016   Pneumococcal Polysaccharide-23 09/03/2015   Td 04/06/2001   Tdap 10/23/2011, 07/02/2018   Zoster, Live 04/06/2013    TDAP status: Up to date  Flu Vaccine status: Up to date  Pneumococcal vaccine status: Up to date  Covid-19 vaccine status: Completed vaccines  Qualifies for Shingles Vaccine? Yes   Zostavax completed Yes   Shingrix Completed?: No.    Education has been provided regarding the importance of this vaccine. Patient has been advised to call insurance company to determine out of pocket expense if they have not yet received this vaccine. Advised may also receive vaccine at local pharmacy or Health Dept. Verbalized acceptance and understanding.  Screening Tests Health Maintenance  Topic Date Due   Zoster Vaccines- Shingrix (1 of 2) Never done   TETANUS/TDAP  07/01/2028   COLONOSCOPY (Pts 45-22yrs Insurance coverage will need to be confirmed)  07/06/2030   Pneumonia Vaccine 71+ Years old  Completed   INFLUENZA VACCINE  Completed   COVID-19 Vaccine  Completed   Hepatitis C Screening  Completed   HPV VACCINES  Aged Out    Health Maintenance  Health Maintenance Due  Topic Date Due   Zoster Vaccines- Shingrix (1 of 2) Never done    Colorectal cancer screening: Type of screening: Colonoscopy. Completed 07/05/2020. Repeat every 10 years  Lung Cancer Screening: (Low Dose CT Chest recommended if Age 60-80 years, 30 pack-year currently smoking OR have quit w/in 15years.) does not qualify.  Additional Screening:  Hepatitis C Screening: does qualify; Completed 11/29/2014  Vision Screening: Recommended annual ophthalmology exams for early detection of glaucoma and other disorders of the eye. Is the patient up  to date with their annual eye exam?  Yes  Who is the provider or what is the name of the office in which the patient attends annual eye exams? Dr. Rosana Hoes  If pt is not established with a provider, would they like to be referred to a provider to establish care? No .   Dental Screening: Recommended annual dental exams for proper oral hygiene  Community Resource Referral / Chronic Care Management: CRR required this visit?  No   CCM required this visit?  No      Plan:     I have personally reviewed and noted the following in the patients chart:   Medical and social history Use of alcohol, tobacco or illicit drugs  Current medications and supplements including opioid prescriptions. Patient is not currently taking opioid prescriptions. Functional ability and status Nutritional status Physical activity Advanced directives List of other physicians Hospitalizations, surgeries, and ER visits in previous 12 months Vitals Screenings to include cognitive, depression, and falls Referrals and appointments  In addition, I have reviewed and discussed with patient certain preventive protocols, quality metrics, and best practice recommendations. A written personalized care plan for preventive services as well as general preventive health recommendations were provided to patient.     Chriss Driver, LPN   [id number]   Nurse Notes: Pt is up to date on all health maintenance. Discussed Shingrix and how to obtain.

## 2021-06-05 NOTE — Patient Instructions (Signed)
Timothy Jimenez , Thank you for taking time to come for your Medicare Wellness Visit. I appreciate your ongoing commitment to your health goals. Please review the following plan we discussed and let me know if I can assist you in the future.   Screening recommendations/referrals: Colonoscopy: 07/05/2020 Repeat in 10 years  Recommended yearly ophthalmology/optometry visit for glaucoma screening and checkup Recommended yearly dental visit for hygiene and checkup  Vaccinations: Influenza vaccine: Done 02/2021 Repeat annually  Pneumococcal vaccine: Done 09/01/2016 and 09/03/2015 Tdap vaccine: 07/02/2018 Repeat in 10 years. Shingles vaccine: Done 04/06/2013 Discussed Shingrix   Covid-19: Done 11/17/2019, 12/18/2019 and 05/10/2020.  Advanced directives: Advance directive discussed with you today. Even though you declined this today, please call our office should you change your mind, and we can give you the proper paperwork for you to fill out.   Conditions/risks identified: Aim for 30 minutes of exercise or brisk walking, 6-8 glasses of water, and 5 servings of fruits and vegetables each day.   Next appointment: Follow up in one year for your annual wellness visit. 2024.  Preventive Care 70 Years and Older, Male  Preventive care refers to lifestyle choices and visits with your health care provider that can promote health and wellness. What does preventive care include? A yearly physical exam. This is also called an annual well check. Dental exams once or twice a year. Routine eye exams. Ask your health care provider how often you should have your eyes checked. Personal lifestyle choices, including: Daily care of your teeth and gums. Regular physical activity. Eating a healthy diet. Avoiding tobacco and drug use. Limiting alcohol use. Practicing safe sex. Taking low doses of aspirin every day. Taking vitamin and mineral supplements as recommended by your health care provider. What happens during  an annual well check? The services and screenings done by your health care provider during your annual well check will depend on your age, overall health, lifestyle risk factors, and family history of disease. Counseling  Your health care provider may ask you questions about your: Alcohol use. Tobacco use. Drug use. Emotional well-being. Home and relationship well-being. Sexual activity. Eating habits. History of falls. Memory and ability to understand (cognition). Work and work Statistician. Screening  You may have the following tests or measurements: Height, weight, and BMI. Blood pressure. Lipid and cholesterol levels. These may be checked every 5 years, or more frequently if you are over 32 years old. Skin check. Lung cancer screening. You may have this screening every year starting at age 20 if you have a 30-pack-year history of smoking and currently smoke or have quit within the past 15 years. Fecal occult blood test (FOBT) of the stool. You may have this test every year starting at age 30. Flexible sigmoidoscopy or colonoscopy. You may have a sigmoidoscopy every 5 years or a colonoscopy every 10 years starting at age 26. Prostate cancer screening. Recommendations will vary depending on your family history and other risks. Hepatitis C blood test. Hepatitis B blood test. Sexually transmitted disease (STD) testing. Diabetes screening. This is done by checking your blood sugar (glucose) after you have not eaten for a while (fasting). You may have this done every 1-3 years. Abdominal aortic aneurysm (AAA) screening. You may need this if you are a current or former smoker. Osteoporosis. You may be screened starting at age 37 if you are at high risk. Talk with your health care provider about your test results, treatment options, and if necessary, the need for more tests.  Vaccines  Your health care provider may recommend certain vaccines, such as: Influenza vaccine. This is recommended  every year. Tetanus, diphtheria, and acellular pertussis (Tdap, Td) vaccine. You may need a Td booster every 10 years. Zoster vaccine. You may need this after age 59. Pneumococcal 13-valent conjugate (PCV13) vaccine. One dose is recommended after age 61. Pneumococcal polysaccharide (PPSV23) vaccine. One dose is recommended after age 35. Talk to your health care provider about which screenings and vaccines you need and how often you need them. This information is not intended to replace advice given to you by your health care provider. Make sure you discuss any questions you have with your health care provider. Document Released: 04/19/2015 Document Revised: 12/11/2015 Document Reviewed: 01/22/2015 Elsevier Interactive Patient Education  2017 [place] Prevention in the Home Falls can cause injuries. They can happen to people of all ages. There are many things you can do to make your home safe and to help prevent falls. What can I do on the outside of my home? Regularly fix the edges of walkways and driveways and fix any cracks. Remove anything that might make you trip as you walk through a door, such as a raised step or threshold. Trim any bushes or trees on the path to your home. Use bright outdoor lighting. Clear any walking paths of anything that might make someone trip, such as rocks or tools. Regularly check to see if handrails are loose or broken. Make sure that both sides of any steps have handrails. Any raised decks and porches should have guardrails on the edges. Have any leaves, snow, or ice cleared regularly. Use sand or salt on walking paths during winter. Clean up any spills in your garage right away. This includes oil or grease spills. What can I do in the bathroom? Use night lights. Install grab bars by the toilet and in the tub and shower. Do not use towel bars as grab bars. Use non-skid mats or decals in the tub or shower. If you need to sit down in the shower,  use a plastic, non-slip stool. Keep the floor dry. Clean up any water that spills on the floor as soon as it happens. Remove soap buildup in the tub or shower regularly. Attach bath mats securely with double-sided non-slip rug tape. Do not have throw rugs and other things on the floor that can make you trip. What can I do in the bedroom? Use night lights. Make sure that you have a light by your bed that is easy to reach. Do not use any sheets or blankets that are too big for your bed. They should not hang down onto the floor. Have a firm chair that has side arms. You can use this for support while you get dressed. Do not have throw rugs and other things on the floor that can make you trip. What can I do in the kitchen? Clean up any spills right away. Avoid walking on wet floors. Keep items that you use a lot in easy-to-reach places. If you need to reach something above you, use a strong step stool that has a grab bar. Keep electrical cords out of the way. Do not use floor polish or wax that makes floors slippery. If you must use wax, use non-skid floor wax. Do not have throw rugs and other things on the floor that can make you trip. What can I do with my stairs? Do not leave any items on the stairs. Make sure that  there are handrails on both sides of the stairs and use them. Fix handrails that are broken or loose. Make sure that handrails are as long as the stairways. Check any carpeting to make sure that it is firmly attached to the stairs. Fix any carpet that is loose or worn. Avoid having throw rugs at the top or bottom of the stairs. If you do have throw rugs, attach them to the floor with carpet tape. Make sure that you have a light switch at the top of the stairs and the bottom of the stairs. If you do not have them, ask someone to add them for you. What else can I do to help prevent falls? Wear shoes that: Do not have high heels. Have rubber bottoms. Are comfortable and fit you  well. Are closed at the toe. Do not wear sandals. If you use a stepladder: Make sure that it is fully opened. Do not climb a closed stepladder. Make sure that both sides of the stepladder are locked into place. Ask someone to hold it for you, if possible. Clearly mark and make sure that you can see: Any grab bars or handrails. First and last steps. Where the edge of each step is. Use tools that help you move around (mobility aids) if they are needed. These include: Canes. Walkers. Scooters. Crutches. Turn on the lights when you go into a dark area. Replace any light bulbs as soon as they burn out. Set up your furniture so you have a clear path. Avoid moving your furniture around. If any of your floors are uneven, fix them. If there are any pets around you, be aware of where they are. Review your medicines with your doctor. Some medicines can make you feel dizzy. This can increase your chance of falling. Ask your doctor what other things that you can do to help prevent falls. This information is not intended to replace advice given to you by your health care provider. Make sure you discuss any questions you have with your health care provider. Document Released: 01/17/2009 Document Revised: 08/29/2015 Document Reviewed: 04/27/2014 Elsevier Interactive Patient Education  2017 [employer].

## 2021-06-06 LAB — TSH: TSH: <0.01 mIU/L — ABNORMAL LOW (ref 0.40–4.50)

## 2021-06-11 ENCOUNTER — Other Ambulatory Visit: Payer: Self-pay

## 2021-06-11 ENCOUNTER — Ambulatory Visit (INDEPENDENT_AMBULATORY_CARE_PROVIDER_SITE_OTHER): Payer: Medicare Other | Admitting: Family Medicine

## 2021-06-11 ENCOUNTER — Encounter: Payer: Self-pay | Admitting: Family Medicine

## 2021-06-11 VITALS — BP 128/78 | HR 77 | Temp 97.7°F | Resp 18 | Ht 75.0 in | Wt 245.0 lb

## 2021-06-11 DIAGNOSIS — E0590 Thyrotoxicosis, unspecified without thyrotoxic crisis or storm: Secondary | ICD-10-CM | POA: Diagnosis not present

## 2021-06-12 ENCOUNTER — Other Ambulatory Visit: Payer: Self-pay | Admitting: Family Medicine

## 2021-06-12 DIAGNOSIS — E0590 Thyrotoxicosis, unspecified without thyrotoxic crisis or storm: Secondary | ICD-10-CM

## 2021-06-12 NOTE — Progress Notes (Signed)
? ?Subjective:  ? ? Patient ID: Timothy Jimenez, male    DOB: [birth year], 71 y.o.   MRN: [id number] ? ?Rash ? ?Patient originally presented due to a photosensitive rash on his face.  He has a red burning rash on his cheeks, on his nose.  Patient has been diagnosed with rosacea and is being currently treated with doxycycline by dermatologist however his wife is concerned that he may have lupus.  She is concerned by the rash but she is also concerned due to his profound fatigue and lethargy.  Of note he has a history of hypothyroidism.  I have not seen the patient in quite some time but the last several TSHs have been undetectable.  I have asked the patient to discontinue levothyroxine altogether which she has.  However on his most recent lab work his TSH was still undetectable indicating hyperthyroidism.  He denies any palpitations.  He denies any tachycardia.  He denies any weight loss.  However he does report heat intolerance and fatigue. ?Past Medical History:  ?Diagnosis Date  ? Allergic rhinitis 1999  ? Asthma   ? GERD (gastroesophageal reflux disease) 2002  ? History of pneumonia 1997  ? Hypothyroidism 2002  ? Other abnormal clinical finding   ? hosptilalized [facility] r/o viral age 71-19/2002  ? SBE (subacute bacterial endocarditis)   ? 2nd to heart murmur (Dr Maple Hudson)  ? Syncope   ? single episode 2012 thought to be vagal episode w/o return of symtpoms  ? ?Past Surgical History:  ?Procedure Laterality Date  ? APPENDECTOMY    ? 64 years old  ? CARDIAC CATHETERIZATION    ? COLONOSCOPY  02/25/2009  ? sm int and ext hemms (Dr. Ewing Schlein)   ? ESOPHAGOGASTRODUODENOSCOPY    ? medium HH esophagitis ( Dr  Ewing Schlein) 02/25/2009  ? TONSILLECTOMY    ? 20 yoa  ? ?Current Outpatient Medications on File Prior to Visit  ?Medication Sig Dispense Refill  ? doxycycline (VIBRAMYCIN) 100 MG capsule Take 100 mg by mouth 2 (two) times daily.    ? metroNIDAZOLE (METROCREAM) 0.75 % cream Apply topically daily.    ? pantoprazole (PROTONIX) 40 MG  tablet TAKE 1 TABLET DAILY 90 tablet 3  ? SSD 1 % cream APPLY CREAM TOPICALLY ONCE DAILY 50 g 0  ? ?No current facility-administered medications on file prior to visit.  ? ? ?Allergies  ?Allergen Reactions  ? Polycillin [Ampicillin]   ?  Hives   ? ?Social History  ? ?Socioeconomic History  ? Marital status: Married  ?  Spouse name: [name]  ? Number of children: 2  ? Years of education: Not on file  ? Highest education level: Not on file  ?Occupational History  ? Not on file  ?Tobacco Use  ? Smoking status: Former  ?  Packs/day: 0.50  ?  Years: 3.00  ?  Pack years: 1.50  ?  Types: Cigarettes  ?  Quit date: 10  ?  Years since quitting: 46.2  ? Smokeless tobacco: Never  ? Tobacco comments:  ?  stopped amoking 35 years ago  ?Vaping Use  ? Vaping Use: Never used  ?Substance and Sexual Activity  ? Alcohol use: No  ? Drug use: No  ? Sexual activity: Yes  ?Other Topics Concern  ? Not on file  ?Social History Narrative  ? Marital Status: Married 1984 LIVES WITH WIFE  ? Children: 2 DAUGHTERS OUT OF HOME  ? Working for emergency response/security at [employer]  ? [employer]  grandfather as of 2016  ? ?Social Determinants of Health  ? ?Financial Resource Strain: Low Risk   ? Difficulty of Paying Living Expenses: Not hard at all  ?Food Insecurity: No Food Insecurity  ? Worried About Programme researcher, broadcasting/film/video in the Last Year: Never true  ? Ran Out of Food in the Last Year: Never true  ?Transportation Needs: No Transportation Needs  ? Lack of Transportation (Medical): No  ? Lack of Transportation (Non-Medical): No  ?Physical Activity: Insufficiently Active  ? Days of Exercise per Week: 5 days  ? Minutes of Exercise per Session: 20 min  ?Stress: No Stress Concern Present  ? Feeling of Stress : Not at all  ?Social Connections: Socially Integrated  ? Frequency of Communication with Friends and Family: More than three times a week  ? Frequency of Social Gatherings with Friends and Family: More than three times a week  ? Attends Religious  Services: More than 4 times per year  ? Active Member of Clubs or Organizations: Yes  ? Attends Banker Meetings: More than 4 times per year  ? Marital Status: Married  ?Intimate Partner Violence: Not At Risk  ? Fear of Current or Ex-Partner: No  ? Emotionally Abused: No  ? Physically Abused: No  ? Sexually Abused: No  ? ?Family History  ?Problem Relation Age of Onset  ? Heart failure Mother   ? Hypertension Mother   ? Stroke Mother   ? Rheumatic fever Brother   ?     age 79 h/o w heart complications  ? Hypertension Brother   ? Stroke Brother   ? Heart disease Brother   ? COPD Sister   ? Kidney failure Father   ? Heart disease Other   ?     many cousins deceased MI x2  ? Hypertension Other   ?     mothers side  ? Diabetes Other   ?     mothers side  ? Lung cancer Other   ?     paternal/maternal grandparents  ? Liver disease Paternal Aunt   ? Prostate cancer Neg Hx   ? Colon cancer Neg Hx   ? ? ? ? ?Review of Systems  ?Skin:  Positive for rash.  ?All other systems reviewed and are negative. ? ?   ?Objective:  ? Physical Exam ?Vitals reviewed.  ?Constitutional:   ?   General: He is not in acute distress. ?   Appearance: He is well-developed. He is not diaphoretic.  ?HENT:  ?   Head: Normocephalic and atraumatic.  ?   Nose: Nose normal.  ?Eyes:  ?   General: No scleral icterus.    ?   Right eye: No discharge.     ?   Left eye: No discharge.  ?   Conjunctiva/sclera: Conjunctivae normal.  ?   Pupils: Pupils are equal, round, and reactive to light.  ?Neck:  ?   Thyroid: No thyromegaly.  ?   Vascular: No JVD.  ?   Trachea: No tracheal deviation.  ?Cardiovascular:  ?   Rate and Rhythm: Normal rate and regular rhythm.  ?   Heart sounds: Normal heart sounds. No murmur heard. ?  No friction rub. No gallop.  ?Pulmonary:  ?   Effort: Pulmonary effort is normal. No respiratory distress.  ?   Breath sounds: Normal breath sounds. No stridor. No wheezing or rales.  ?Chest:  ?   Chest wall: No tenderness.   ?Genitourinary: ?  Penis: Normal.   ?   Prostate: Normal.  ?   Rectum: Normal.  ?Musculoskeletal:     ?   General: No tenderness. Normal range of motion.  ?   Cervical back: Normal range of motion and neck supple.  ?Lymphadenopathy:  ?   Cervical: No cervical adenopathy.  ?Skin: ?   General: Skin is warm.  ?   Coloration: Skin is not pale.  ?   Findings: Erythema and rash present.  ? ?    ?Neurological:  ?   Mental Status: He is alert and oriented to person, place, and time.  ?   Cranial Nerves: No cranial nerve deficit.  ?   Motor: No abnormal muscle tone.  ?   Coordination: Coordination normal.  ?   Deep Tendon Reflexes: Reflexes are normal and symmetric.  ?Psychiatric:     ?   Behavior: Behavior normal.     ?   Thought Content: Thought content normal.     ?   Judgment: Judgment normal.  ? ? ? ? ? ?   ?Assessment & Plan:  ?Hyperthyroidism - Plan: T3, Free, T4, Free, Thyroid stimulating immunoglobulin, Thyroid Peroxidase Antibodies (TPO) (REFL), ANA, CBC with Differential/Platelet, COMPLETE METABOLIC PANEL WITH GFR ?Rash on patient's face seems more consistent with rosacea than lupus however I will gladly check an ANA to assuage the patient's fears.  However I am concerned that he has developed hyperthyroidism after being treated for hypothyroidism for years.  I will check a free T3 and a free T4 to confirm this.  I will also check a thyroid-stimulating immunoglobulin to screen for Graves' disease as well as a thyroid peroxidase antibody to screen for Hashimoto's thyroiditis.  If free T3 and free T4 are elevated, will obtain a radioactive iodine uptake scan to evaluate for potential causes to determine treatment. ?

## 2021-06-13 ENCOUNTER — Telehealth: Payer: Self-pay

## 2021-06-13 NOTE — Telephone Encounter (Signed)
Pt called in inquiring about lab results. Please advise. ? ?Cb#: [phone] ?

## 2021-06-13 NOTE — Telephone Encounter (Signed)
Please see result note 

## 2021-06-16 ENCOUNTER — Telehealth: Payer: Self-pay | Admitting: Family Medicine

## 2021-06-16 NOTE — Telephone Encounter (Signed)
Patient called to follow up on recent labs; requesting results.  ? ?Please advise at [phone], or [phone]. ?

## 2021-06-16 NOTE — Telephone Encounter (Signed)
Spoke with patient and reviewed results and recommendations. Patient aware of scan ordered. Nothing further needed at this time.  ? ?

## 2021-06-25 ENCOUNTER — Encounter (HOSPITAL_COMMUNITY)
Admission: RE | Admit: 2021-06-25 | Discharge: 2021-06-25 | Disposition: A | Discharge status: Home / Self Care | Payer: Medicare Other | Source: Ambulatory Visit | Attending: Family Medicine | Admitting: Family Medicine

## 2021-06-25 ENCOUNTER — Encounter (HOSPITAL_COMMUNITY): Payer: Self-pay

## 2021-06-25 ENCOUNTER — Other Ambulatory Visit: Payer: Self-pay

## 2021-06-25 DIAGNOSIS — E0590 Thyrotoxicosis, unspecified without thyrotoxic crisis or storm: Secondary | ICD-10-CM | POA: Diagnosis present

## 2021-06-25 MED ORDER — SODIUM IODIDE I-123 7.4 MBQ CAPS
424.0000 | ORAL_CAPSULE | Freq: Once | ORAL | Status: AC
  Administered 2021-06-25: 424 via ORAL

## 2021-06-26 ENCOUNTER — Encounter (HOSPITAL_COMMUNITY)
Admission: RE | Admit: 2021-06-26 | Discharge: 2021-06-26 | Disposition: A | Discharge status: Home / Self Care | Payer: Medicare Other | Source: Ambulatory Visit | Attending: Family Medicine | Admitting: Family Medicine

## 2021-06-26 ENCOUNTER — Encounter (HOSPITAL_COMMUNITY): Payer: Medicare Other

## 2021-06-26 LAB — CBC WITH DIFFERENTIAL/PLATELET
Absolute Monocytes: 696 cells/uL (ref 200–950)
Basophils Absolute: 20 cells/uL (ref 0–200)
Basophils Relative: 0.4 %
Eosinophils Absolute: 88 cells/uL (ref 15–500)
Eosinophils Relative: 1.8 %
HCT: 41.9 % (ref 38.5–50.0)
Hemoglobin: 13.9 g/dL (ref 13.2–17.1)
Lymphs Abs: 1274 cells/uL (ref 850–3900)
MCH: 29.4 pg (ref 27.0–33.0)
MCHC: 33.2 g/dL (ref 32.0–36.0)
MCV: 88.6 fL (ref 80.0–100.0)
MPV: 10.1 fL (ref 7.5–12.5)
Monocytes Relative: 14.2 %
Neutro Abs: 2822 cells/uL (ref 1500–7800)
Neutrophils Relative %: 57.6 %
Platelets: 226 10*3/uL (ref 140–400)
RBC: 4.73 10*6/uL (ref 4.20–5.80)
RDW: 12.7 % (ref 11.0–15.0)
Total Lymphocyte: 26 %
WBC: 4.9 10*3/uL (ref 3.8–10.8)

## 2021-06-26 LAB — COMPLETE METABOLIC PANEL WITH GFR
AG Ratio: 1.5 (calc) (ref 1.0–2.5)
ALT: 18 U/L (ref 9–46)
AST: 19 U/L (ref 10–35)
Albumin: 4.1 g/dL (ref 3.6–5.1)
Alkaline phosphatase (APISO): 90 U/L (ref 35–144)
BUN: 12 mg/dL (ref 7–25)
CO2: 26 mmol/L (ref 20–32)
Calcium: 9.2 mg/dL (ref 8.6–10.3)
Chloride: 105 mmol/L (ref 98–110)
Creat: 1.18 mg/dL (ref 0.70–1.28)
Globulin: 2.8 g/dL (calc) (ref 1.9–3.7)
Glucose, Bld: 107 mg/dL — ABNORMAL HIGH (ref 65–99)
Potassium: 4.1 mmol/L (ref 3.5–5.3)
Sodium: 141 mmol/L (ref 135–146)
Total Bilirubin: 0.5 mg/dL (ref 0.2–1.2)
Total Protein: 6.9 g/dL (ref 6.1–8.1)
eGFR: 66 mL/min/{1.73_m2} (ref >=60)

## 2021-06-26 LAB — T4, FREE: Free T4: 1.6 ng/dL (ref 0.8–1.8)

## 2021-06-26 LAB — ANA: Anti Nuclear Antibody (ANA): NEGATIVE

## 2021-06-26 LAB — THYROID STIMULATING IMMUNOGLOBULIN

## 2021-06-26 LAB — T3, FREE: T3, Free: 6.6 pg/mL — ABNORMAL HIGH (ref 2.3–4.2)

## 2021-06-26 LAB — THYROID PEROXIDASE ANTIBODIES (TPO) (REFL): Thyroperoxidase Ab SerPl-aCnc: 583 IU/mL — ABNORMAL HIGH (ref <9)

## 2021-06-27 ENCOUNTER — Other Ambulatory Visit: Payer: Self-pay

## 2021-06-27 DIAGNOSIS — E0590 Thyrotoxicosis, unspecified without thyrotoxic crisis or storm: Secondary | ICD-10-CM

## 2021-07-04 ENCOUNTER — Telehealth: Payer: Self-pay

## 2021-07-04 NOTE — Telephone Encounter (Signed)
Pt called in stating [facility] is no longer taking new pt's. Pt states that he had a referral to be seen there and wants to know if there is another place where he can go with his referral to be seen please. Please advise. ? ?Cb#: [phone] ?

## 2021-07-10 ENCOUNTER — Encounter: Payer: Self-pay | Admitting: Nurse Practitioner

## 2021-07-10 ENCOUNTER — Ambulatory Visit (INDEPENDENT_AMBULATORY_CARE_PROVIDER_SITE_OTHER): Payer: Medicare Other | Admitting: Nurse Practitioner

## 2021-07-10 VITALS — BP 129/72 | HR 70 | Ht 74.0 in | Wt 237.6 lb

## 2021-07-10 DIAGNOSIS — E0590 Thyrotoxicosis, unspecified without thyrotoxic crisis or storm: Secondary | ICD-10-CM | POA: Diagnosis not present

## 2021-07-10 NOTE — Patient Instructions (Signed)
Radioiodine (I-131) Therapy for Hyperthyroidism ?Radioiodine (I-131) therapy is a treatment for an overactive thyroid gland (hyperthyroidism). The thyroid is a gland in the neck that uses iodine to help control how the body uses food (metabolism). This treatment involves swallowing a pill or liquid that contains I-131. I-131 is manufactured (synthetic) iodine that gives off radiation. After it is swallowed, the I-131 will be absorbed by the thyroid gland over the next few months. It will destroy thyroid cells and reverse hyperthyroidism. ?Tell a health care provider about: ?Any allergies you have. ?All medicines you are taking, including vitamins, herbs, eye drops, creams, and over-the-counter medicines. ?Any blood disorders you have. ?Any surgeries you have had. ?Any medical conditions you have. ?Whether you are pregnant, may be pregnant, or have gone through menopause, if this applies. ?Whether you currently have children. ?Whether you are breastfeeding. ?Whether you plan to have children in the next 2 years. ?Any contact you have with children or pregnant women. ?Your travel plans for the next 3 months. ?Whether you pass through radiation detectors for work or travel. ?What are the risks? ?Generally, this is a safe procedure. However, problems may occur, including: ?Damage to other structures or organs, such as the salivary glands. This could lead to dry mouth and loss of taste. ?Low sperm count, if this applies. This may lead to temporary infertility. ?Sore throat or neck pain. This is temporary. ?Slightly increased risk of thyroid cancer. ?Nausea or vomiting. ?What happens before the procedure? ?Staying hydrated ?Follow instructions from your health care provider about hydration, which may include: ?Up to 2 hours before the procedure - you may continue to drink clear liquids, such as water, clear fruit juice, black coffee, and plain tea. ?Eating and drinking restrictions ?Follow instructions from your health  care provider about eating and drinking restrictions. ?Follow a low-iodine diet as told by your health care provider. Check ingredients on packaged foods and drinks because there are foods that you will need to avoid while on the low-iodine diet: ?Avoid iodized table salt and foods that have iodized salt. ?Avoid seafood, seaweed, soybeans, and soy products. ?Avoid dairy products and eggs. ?Avoid the food dye Red No. 3 because it has iodine. ?Medicines ? ?Ask your health care provider about: ?Changing or stopping your regular medicines. This is especially important if you are taking diabetes medicines, blood thinners, or thyroid medicines. ?Taking over-the-counter medicines, vitamins, herbs, and supplements. ?General instructions ?Women may be asked to take a pregnancy test. ?Women who are breastfeeding should: ?Plan to stop at least 6 weeks before the procedure. ?Not go back to breastfeeding after the procedure until their health care provider approves. ?Plan to avoid contact with other people for 1 week after your treatment. Avoiding contact with children and pregnant women is especially important. To do this, plan to stay home from work, arrange child care, and sleep alone, if these things apply to you. ?Plan to drive yourself home after treatment. Do not take public transportation. If you need someone to drive you home, sit as far away from the driver as possible. ?What happens during the procedure? ?You will be given a dose of I-131 to swallow. It may be a pill or a liquid. ?Your thyroid gland will absorb the I-131 over the next 3 months. The treatment process will be complete in about 6 months. ?What happens after the procedure? ?You may need to stay in the hospital for 24 hours after your treatment. This depends on the requirements in your state. ?Follow instructions   from your health care provider about: ?How to take care of yourself after the procedure. ?How to protect others from exposure to radiation as it  leaves your body. ?Summary ?Radioiodine (I-131) therapy is a treatment for an overactive thyroid gland (hyperthyroidism). ?This treatment involves swallowing a pill or liquid that contains I-131. I-131 is manufactured iodine that gives off radiation. ?Your thyroid gland will absorb the I-131 over the next 3 months. The I-131 destroys thyroid cells and reverses hyperthyroidism. ?Follow instructions from your health care provider about how to take care of yourself and how to protect other people from exposure to radiation after the procedure. ?This information is not intended to replace advice given to you by your health care provider. Make sure you discuss any questions you have with your health care provider. ?Document Revised: 05/05/2018 Document Reviewed: 05/05/2018 ?Elsevier Patient Education ? 2022 [employer] ? ?

## 2021-07-10 NOTE — Progress Notes (Signed)
? ? ? 07/10/2021   ? ? ?Endocrinology Consult Note  ? ? ?Subjective:  ? ? Patient ID: Timothy Jimenez, male    DOB: [birth year], PCP Tanya Nones Priscille Heidelberg, MD. ? ? ?Past Medical History:  ?Diagnosis Date  ? Allergic rhinitis 1999  ? Asthma   ? GERD (gastroesophageal reflux disease) 2002  ? History of pneumonia 1997  ? Hypothyroidism 2002  ? Other abnormal clinical finding   ? hosptilalized [facility] r/o viral age 71-19/2002  ? SBE (subacute bacterial endocarditis)   ? 2nd to heart murmur (Dr Maple Hudson)  ? Syncope   ? single episode 2012 thought to be vagal episode w/o return of symtpoms  ? ? ?Past Surgical History:  ?Procedure Laterality Date  ? APPENDECTOMY    ? 12 years old  ? CARDIAC CATHETERIZATION    ? COLONOSCOPY  02/25/2009  ? sm int and ext hemms (Dr. Ewing Schlein)   ? ESOPHAGOGASTRODUODENOSCOPY    ? medium HH esophagitis ( Dr  Ewing Schlein) 02/25/2009  ? TONSILLECTOMY    ? 20 yoa  ? ? ?Social History  ? ?Socioeconomic History  ? Marital status: Married  ?  Spouse name: [name]  ? Number of children: 2  ? Years of education: Not on file  ? Highest education level: Not on file  ?Occupational History  ? Not on file  ?Tobacco Use  ? Smoking status: Former  ?  Packs/day: 0.50  ?  Years: 3.00  ?  Pack years: 1.50  ?  Types: Cigarettes  ?  Quit date: 40  ?  Years since quitting: 46.2  ? Smokeless tobacco: Never  ? Tobacco comments:  ?  stopped amoking 35 years ago  ?Vaping Use  ? Vaping Use: Never used  ?Substance and Sexual Activity  ? Alcohol use: No  ? Drug use: No  ? Sexual activity: Yes  ?Other Topics Concern  ? Not on file  ?Social History Narrative  ? Marital Status: Married 1984 LIVES WITH WIFE  ? Children: 2 DAUGHTERS OUT OF HOME  ? Working for emergency response/security at [employer]  ? Great grandfather as of 2016  ? ?Social Determinants of Health  ? ?Financial Resource Strain: Low Risk   ? Difficulty of Paying Living Expenses: Not hard at all  ?Food Insecurity: No Food Insecurity  ? Worried About Programme researcher, broadcasting/film/video in  the Last Year: Never true  ? Ran Out of Food in the Last Year: Never true  ?Transportation Needs: No Transportation Needs  ? Lack of Transportation (Medical): No  ? Lack of Transportation (Non-Medical): No  ?Physical Activity: Insufficiently Active  ? Days of Exercise per Week: 5 days  ? Minutes of Exercise per Session: 20 min  ?Stress: No Stress Concern Present  ? Feeling of Stress : Not at all  ?Social Connections: Socially Integrated  ? Frequency of Communication with Friends and Family: More than three times a week  ? Frequency of Social Gatherings with Friends and Family: More than three times a week  ? Attends Religious Services: More than 4 times per year  ? Active Member of Clubs or Organizations: Yes  ? Attends Banker Meetings: More than 4 times per year  ? Marital Status: Married  ? ? ?Family History  ?Problem Relation Age of Onset  ? Thyroid disease Mother   ? Heart failure Mother   ? Hypertension Mother   ? Stroke Mother   ? Kidney disease Father   ? Hypertension Father   ?  Kidney failure Father   ? Heart attack Father   ? COPD Sister   ? Rheumatic fever Brother   ?     age 71 h/o w heart complications  ? Hypertension Brother   ? Stroke Brother   ? Heart disease Brother   ? Liver disease Paternal Aunt   ? Heart disease Other   ?     many cousins deceased MI x2  ? Hypertension Other   ?     mothers side  ? Diabetes Other   ?     mothers side  ? Lung cancer Other   ?     paternal/maternal grandparents  ? Prostate cancer Neg Hx   ? Colon cancer Neg Hx   ? ? ?Outpatient Encounter Medications as of 07/10/2021  ?Medication Sig  ? doxycycline (VIBRAMYCIN) 100 MG capsule Take 100 mg by mouth 2 (two) times daily.  ? metroNIDAZOLE (METROCREAM) 0.75 % cream Apply topically daily.  ? SSD 1 % cream APPLY CREAM TOPICALLY ONCE DAILY  ? pantoprazole (PROTONIX) 40 MG tablet TAKE 1 TABLET DAILY (Patient not taking: Reported on 07/10/2021)  ? ?No facility-administered encounter medications on file as of 07/10/2021.   ? ? ?ALLERGIES: ?Allergies  ?Allergen Reactions  ? Polycillin [Ampicillin]   ?  Hives   ? ? ?VACCINATION STATUS: ?Immunization History  ?Administered Date(s) Administered  ? Fluad Quad(high Dose 65+) 02/16/2019  ? Influenza, High Dose Seasonal PF 01/07/2018  ? Influenza-Unspecified 02/18/2021  ? PFIZER(Purple Top)SARS-COV-2 Vaccination 11/17/2019, 12/08/2019  ? Research officer, trade [employer]fizer Covid-19 Vaccine Bivalent Booster 4565yrs & up 05/10/2020  ? Pneumococcal Conjugate-13 09/01/2016  ? Pneumococcal Polysaccharide-23 09/03/2015  ? Td 04/06/2001  ? Tdap 10/23/2011, 07/02/2018  ? Zoster, Live 04/06/2013  ? ? ? ?HPI ? ?Timothy Jimenez is 71 y.o. male who presents today with a medical history as above. he is being seen in consultation for hyperthyroidism requested by Donita BrooksPickard, Warren T, MD.  He was diagnosed years ago with under-active thyroid and had been on thyroid hormone replacement up until about 12 weeks ago where he was taken off due to undetectable TSH. he has been dealing with symptoms of heat intolerance, fatigue, unintentional weight loss, anxiety, palpitation, diarrhea, and worsening hoarseness for ?the last 6 months but his wife noticed this change starting around 1 year ago. These symptoms are progressively worsening and troubling to him.  his most recent thyroid labs revealed suppressed TSH of < 0.01, FT4 of 1.6 and FT3 of 6.6 on 06/05/21.  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 ? ?he denies dysphagia, choking, shortness of breath, but wife has noticed some recent voice change.  ?  ?he does have family history of thyroid dysfunction in his mother, but denies family hx of thyroid cancer. he denies personal history of goiter. he is not currently on any anti-thyroid medications nor on any thyroid hormone supplements. Denies use of Biotin containing supplements.  he is willing to proceed with appropriate work up and therapy for thyrotoxicosis. ? ? ?Review of systems ? ?Constitutional:  + steadily decreasing body weight, current Body mass index is 30.51 kg/m?., + fatigue, + subjective hyperthermia, no subjective hypothermia ?Eyes: no blurry vision, no xerophthalmia ?ENT: no sore throat, no nodules palpated in throat, no dysphagia/odynophagia, + hoarseness ?Cardiovascular: no chest pain, no shortness of breath, + palpitations, no leg swelling ?Respiratory: no cough, no shortness of breath ?Gastrointestinal: no nausea/vomiting, + intermittent diarrhea ?Musculoskeletal: no muscle/joint aches ?Skin:+ recent rash to face- has  seen PCP and dermatology, no hyperemia ?Neurological: + tremors, no numbness, no tingling, no dizziness ?Psychiatric: no depression, + anxiety ? ? ?Objective:  ?  ?BP 129/72   Pulse 70   Ht 6\' 2"  (1.88 m)   Wt 237 lb 9.6 oz (107.8 kg)   SpO2 97%   BMI 30.51 kg/m?   ?Wt Readings from Last 3 Encounters:  ?07/10/21 237 lb 9.6 oz (107.8 kg)  ?06/11/21 245 lb (111.1 kg)  ?06/05/21 245 lb 12.8 oz (111.5 kg)  ?  ? ?BP Readings from Last 3 Encounters:  ?07/10/21 129/72  ?06/11/21 128/78  ?06/05/21 130/74  ?                     ? ?Physical Exam- Limited ? ?Constitutional:  Body mass index is 30.51 kg/m?. , not in acute distress, normal state of mind ?Eyes:  EOMI, no exophthalmos ?Neck: Supple ?Thyroid: No gross goiter ?Cardiovascular: RRR, no murmurs, rubs, or gallops, no edema ?Respiratory: Adequate breathing efforts, no crackles, rales, rhonchi, or wheezing ?Musculoskeletal: no gross deformities, strength intact in all four extremities, no gross restriction of joint movements ?Skin:  no rashes, no hyperemia ?Neurological: mild tremor with outstretched hands, normal DTR bilaterally ? ? ?CMP  ?   ?Component Value Date/Time  ? NA 141 06/11/2021 1630  ? K 4.1 06/11/2021 1630  ? CL 105 06/11/2021 1630  ? CO2 26 06/11/2021 1630  ? GLUCOSE 107 (H) 06/11/2021 1630  ? BUN 12 06/11/2021 1630  ? CREATININE 1.18 06/11/2021 1630  ? CALCIUM 9.2 06/11/2021 1630  ? PROT 6.9 06/11/2021 1630  ?  ALBUMIN 4.2 08/07/2016 0858  ? AST 19 06/11/2021 1630  ? ALT 18 06/11/2021 1630  ? ALKPHOS 76 08/07/2016 0858  ? BILITOT 0.5 06/11/2021 1630  ? GFRNONAA 62 08/26/2020 1452  ? GFRAA 71 08/26/2020 1452  ? ? ? ?CBC

## 2021-07-17 NOTE — Written Directive (Addendum)
MOLECULAR IMAGING AND THERAPEUTICS WRITTEN DIRECTIVE ? ? ?PATIENT NAME: Timothy Jimenez ? ?PT DOB:   [birth year] ?                                             ?MRN: [id number] ? ?--------------------------------------------------------------------------------------------------------------------- ? ? ?I-131 WHOLE THYROID THERAPY (NON-CANCER) ? ? ? ?RADIOPHARMACEUTICAL:   Iodine-131 Capsule  ? ? ?PRESCRIBED DOSE FOR ADMINISTRATION: 20 mCi  (twenty milliCuries) ? ? ?ROUTE OFADMINISTRATION: PO ? ? ?DIAGNOSIS:  Hyperthyroidism ? ? ?REFERRING PHYSICIAN: Dr. Stan Head ? ? ?TSH:    ?Lab Results  ?Component Value Date  ? TSH <0.01 (L) 06/05/2021  ? TSH <0.01 (L) 04/21/2021  ? TSH 0.09 (L) 02/17/2021  ? ? ? ?PRIOR I-131 THERAPY (Date and Dose): ? ? ?PRIOR RADIOLOGY EXAMS (Results and Date): ?NM THYROID MULT UPTAKE W/IMAGING ? ?Result Date: 06/26/2021 ?CLINICAL DATA:  Hypothyroidism, cessation of levothyroxine therapy 12 weeks ago EXAM: THYROID SCAN AND UPTAKE - 4 AND 24 HOURS TECHNIQUE: Following oral administration of I-123 capsule, anterior planar imaging was acquired at 24 hours. Thyroid uptake was calculated with a thyroid probe at 4-6 hours and 24 hours. RADIOPHARMACEUTICALS:  Four under 24 uCi I-123 sodium iodide p.o. COMPARISON:  No relevant prior studies available for comparison at this institution FINDINGS: Planar imaging the thyroid demonstrates normal homogeneous symmetrical uptake within the thyroid. No focal lesions identified. 4 hour I-123 uptake = 20.3% (normal 5-20%) 24 hour I-123 uptake = 45.7% (normal 10-30%) IMPRESSION: 1. Elevated iodine uptake values at 4 hours and 24 hours. 2. No focal parenchymal abnormalities identified on imaging. Electronically Signed   By: Randa Ngo M.D.   On: 06/26/2021 17:26    ? ? ?ADDITIONAL PHYSICIAN COMMENTS/NOTES ? ? ?AUTHORIZED USER SIGNATURE & TIME STAMP: ?

## 2021-07-23 ENCOUNTER — Encounter (HOSPITAL_COMMUNITY): Payer: Self-pay

## 2021-07-23 ENCOUNTER — Encounter (HOSPITAL_COMMUNITY)
Admission: RE | Admit: 2021-07-23 | Discharge: 2021-07-23 | Disposition: A | Discharge status: Home / Self Care | Payer: Medicare Other | Source: Ambulatory Visit | Attending: Nurse Practitioner | Admitting: Nurse Practitioner

## 2021-07-23 DIAGNOSIS — E0590 Thyrotoxicosis, unspecified without thyrotoxic crisis or storm: Secondary | ICD-10-CM | POA: Diagnosis present

## 2021-07-23 MED ORDER — SODIUM IODIDE I 131 CAPSULE
20.0000 | Freq: Once | INTRAVENOUS | Status: AC | PRN
Start: 2021-07-23 — End: 2021-07-23
  Administered 2021-07-23: 20.2 via ORAL

## 2021-08-27 LAB — T4, FREE: Free T4: 0.68 ng/dL — ABNORMAL LOW (ref 0.82–1.77)

## 2021-08-27 LAB — T3, FREE: T3, Free: 2.1 pg/mL (ref 2.0–4.4)

## 2021-08-27 LAB — TSH: TSH: 1.29 u[IU]/mL (ref 0.450–4.500)

## 2021-09-04 ENCOUNTER — Encounter: Payer: Self-pay | Admitting: Nurse Practitioner

## 2021-09-04 ENCOUNTER — Ambulatory Visit (INDEPENDENT_AMBULATORY_CARE_PROVIDER_SITE_OTHER): Payer: Medicare Other | Admitting: Nurse Practitioner

## 2021-09-04 VITALS — BP 136/79 | HR 66 | Ht 75.0 in | Wt 240.0 lb

## 2021-09-04 DIAGNOSIS — E890 Postprocedural hypothyroidism: Secondary | ICD-10-CM | POA: Diagnosis not present

## 2021-09-04 MED ORDER — LEVOTHYROXINE SODIUM 50 MCG PO TABS: 50.0000 ug | ORAL_TABLET | Freq: Every day | ORAL | 3 refills | Status: DC

## 2021-09-04 NOTE — Patient Instructions (Signed)

## 2021-09-04 NOTE — Progress Notes (Signed)
09/04/2021     Endocrinology Follow Up Note    Subjective:    Patient ID: Timothy Jimenez, male    DOB: 12/14/1950, PCP Donita Brooks, MD.   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 Ewing Schlein)    ESOPHAGOGASTRODUODENOSCOPY     medium HH esophagitis ( Dr  Ewing Schlein) 02/25/2009   TONSILLECTOMY     20 yoa    Social History   Socioeconomic History   Marital status: Married    Spouse name: [name]   Number of children: 2   Years of education: Not on file   Highest education level: Not on file  Occupational History   Not on file  Tobacco Use   Smoking status: Former    Packs/day: 0.50    Years: 3.00    Pack years: 1.50    Types: Cigarettes    Quit date: 1977    Years since quitting: 46.4   Smokeless tobacco: Never   Tobacco comments:    stopped amoking 35 years ago  Vaping Use   Vaping Use: Never used  Substance and Sexual Activity   Alcohol use: No   Drug use: No   Sexual activity: Yes  Other Topics Concern   Not on file  Social History Narrative   Marital Status: Married 1984 LIVES WITH WIFE   Children: 2 DAUGHTERS OUT OF HOME   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   Social Determinants of Health   Financial Resource Strain: Low Risk    Difficulty of Paying Living Expenses: Not hard at all  Food Insecurity: No Food Insecurity   Worried About Programme researcher, broadcasting/film/video in  the Last Year: Never true   Barista in the Last Year: Never true  Transportation Needs: No Transportation Needs   Lack of Transportation (Medical): No   Lack of Transportation (Non-Medical): No  Physical Activity: Insufficiently Active   Days of Exercise per Week: 5 days   Minutes of Exercise per Session: 20 min  Stress: No Stress Concern Present   Feeling of Stress : Not at all  Social Connections: Socially Integrated   Frequency of Communication with Friends and Family: More than three times a week   Frequency of Social Gatherings with Friends and Family: More than three times a week   Attends Religious Services: More than 4 times per year   Active Member of [employer] or Organizations: Yes   Attends Engineer, structural: More than 4 times per year   Marital Status: Married    Family History  Problem Relation Age of Onset   Thyroid disease Mother    Heart failure Mother    Hypertension Mother    Stroke Mother    Kidney disease Father    Hypertension Father  Kidney failure Father    Heart attack Father    COPD Sister    Rheumatic fever Brother        age 71 h/o w heart complications   Hypertension Brother    Stroke Brother    Heart disease Brother    Liver disease Paternal Aunt    Heart disease Other        many cousins deceased MI x2   Hypertension Other        mothers side   Diabetes Other        mothers side   Lung cancer Other        paternal/maternal grandparents   Prostate cancer Neg Hx    Colon cancer Neg Hx     Outpatient Encounter Medications as of 09/04/2021  Medication Sig   levothyroxine (SYNTHROID) 50 MCG tablet Take 1 tablet (50 mcg total) by mouth daily before breakfast.   doxycycline (VIBRAMYCIN) 100 MG capsule Take 100 mg by mouth 2 (two) times daily.   metroNIDAZOLE (METROCREAM) 0.75 % cream Apply topically daily.   SSD 1 % cream APPLY CREAM TOPICALLY ONCE DAILY   [DISCONTINUED] pantoprazole (PROTONIX) 40 MG tablet TAKE 1 TABLET  DAILY (Patient not taking: Reported on 07/10/2021)   No facility-administered encounter medications on file as of 09/04/2021.    ALLERGIES: Allergies  Allergen Reactions   Polycillin [Ampicillin]     Hives     VACCINATION STATUS: Immunization History  Administered Date(s) Administered   Fluad Quad(high Dose 65+) 02/16/2019   Influenza, High Dose Seasonal PF 01/07/2018   Influenza-Unspecified 02/18/2021   PFIZER(Purple Top)SARS-COV-2 Vaccination 11/17/2019, 12/08/2019   Pfizer Covid-19 Vaccine Bivalent Booster 5769yrs & up 05/10/2020   Pneumococcal Conjugate-13 09/01/2016   Pneumococcal Polysaccharide-23 09/03/2015   Td 04/06/2001   Tdap 10/23/2011, 07/02/2018   Zoster, Live 04/06/2013     HPI  Timothy Jimenez is 71 y.o. male who presents today with a medical history as above. he is being seen in follow up after being seen in consultation for hyperthyroidism requested by Donita BrooksPickard, Warren T, MD.  He was diagnosed years ago with under-active thyroid and had been on thyroid hormone replacement up until about 12 weeks ago where he was taken off due to undetectable TSH. he has been dealing with symptoms of heat intolerance, fatigue, unintentional weight loss, anxiety, palpitation, diarrhea, and worsening hoarseness for ?the last 6 months but his wife noticed this change starting around 1 year ago. These symptoms are progressively worsening and troubling to him.  his most recent thyroid labs revealed suppressed TSH of < 0.01, FT4 of 1.6 and FT3 of 6.6 on 06/05/21.  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  he denies dysphagia, choking, shortness of breath, but wife has noticed some recent voice change.    he does have family history of thyroid dysfunction in his mother, but denies family hx of thyroid cancer. he denies personal history of goiter. he is not currently on any anti-thyroid medications nor on any thyroid hormone supplements. Denies use of  Biotin containing supplements.  he is willing to proceed with appropriate work up and therapy for thyrotoxicosis.   Review of systems  Constitutional: + Minimally fluctuating body weight,  current Body mass index is 30 kg/m. , no fatigue, no subjective hyperthermia, no subjective hypothermia Eyes: no blurry vision, no xerophthalmia ENT: no sore throat, no nodules palpated in throat, no dysphagia/odynophagia, no hoarseness Cardiovascular: no chest pain, no shortness of breath, no  palpitations, no leg swelling Respiratory: no cough, no shortness of breath Gastrointestinal: no nausea/vomiting/diarrhea Musculoskeletal: no muscle/joint aches Skin: no rashes, no hyperemia Neurological: no tremors, no numbness, no tingling, no dizziness Psychiatric: no depression, no anxiety   Objective:    BP 136/79   Pulse 66   Ht 6\' 3"  (1.905 m)   Wt 240 lb (108.9 kg)   BMI 30.00 kg/m   Wt Readings from Last 3 Encounters:  09/04/21 240 lb (108.9 kg)  07/10/21 237 lb 9.6 oz (107.8 kg)  06/11/21 245 lb (111.1 kg)     BP Readings from Last 3 Encounters:  09/04/21 136/79  07/10/21 129/72  06/11/21 128/78                         Physical Exam- Limited  Constitutional:  Body mass index is 30 kg/m. , not in acute distress, normal state of mind Eyes:  EOMI, no exophthalmos Neck: Supple Cardiovascular: RRR, no murmurs, rubs, or gallops, no edema Respiratory: Adequate breathing efforts, no crackles, rales, rhonchi, or wheezing Musculoskeletal: no gross deformities, strength intact in all four extremities, no gross restriction of joint movements Skin:  no rashes, no hyperemia Neurological: no tremor with outstretched hands   CMP     Component Value Date/Time   NA 141 06/11/2021 1630   K 4.1 06/11/2021 1630   CL 105 06/11/2021 1630   CO2 26 06/11/2021 1630   GLUCOSE 107 (H) 06/11/2021 1630   BUN 12 06/11/2021 1630   CREATININE 1.18 06/11/2021 1630   CALCIUM 9.2 06/11/2021 1630   PROT  6.9 06/11/2021 1630   ALBUMIN 4.2 08/07/2016 0858   AST 19 06/11/2021 1630   ALT 18 06/11/2021 1630   ALKPHOS 76 08/07/2016 0858   BILITOT 0.5 06/11/2021 1630   GFRNONAA 62 08/26/2020 1452   GFRAA 71 08/26/2020 1452     CBC    Component Value Date/Time   WBC 4.9 06/11/2021 1630   RBC 4.73 06/11/2021 1630   HGB 13.9 06/11/2021 1630   HCT 41.9 06/11/2021 1630   PLT 226 06/11/2021 1630   MCV 88.6 06/11/2021 1630   MCH 29.4 06/11/2021 1630   MCHC 33.2 06/11/2021 1630   RDW 12.7 06/11/2021 1630   LYMPHSABS 1,274 06/11/2021 1630   MONOABS 574 08/07/2016 0858   EOSABS 88 06/11/2021 1630   BASOSABS 20 06/11/2021 1630     Diabetic Labs (most recent): Lab Results  Component Value Date   HGBA1C 5.7 06/18/2011    Lipid Panel     Component Value Date/Time   CHOL 138 12/16/2020 0811   TRIG 96 12/16/2020 0811   HDL 36 (L) 12/16/2020 0811   CHOLHDL 3.8 12/16/2020 0811   VLDL 11 08/07/2016 0858   LDLCALC 83 12/16/2020 0811     Lab Results  Component Value Date   TSH 1.290 08/26/2021   TSH <0.01 (L) 06/05/2021   TSH <0.01 (L) 04/21/2021   TSH 0.09 (L) 02/17/2021   TSH <0.01 (L) 12/16/2020   TSH 3.09 08/26/2020   TSH 1.62 11/25/2017   TSH 2.98 08/07/2016   TSH 1.78 03/03/2016   TSH 2.26 09/03/2015   FREET4 0.68 (L) 08/26/2021   FREET4 1.6 06/11/2021   FREET4 1.0 02/17/2021   FREET4 1.1 08/05/2009   FREET4 0.7 05/31/2006    Uptake and Scan 06/26/21  CLINICAL DATA:  Hypothyroidism, cessation of levothyroxine therapy 12 weeks ago   EXAM: THYROID SCAN AND UPTAKE - 4 AND 24 HOURS  TECHNIQUE: Following oral administration of I-123 capsule, anterior planar imaging was acquired at 24 hours. Thyroid uptake was calculated with a thyroid probe at 4-6 hours and 24 hours.   RADIOPHARMACEUTICALS:  Four under 24 uCi I-123 sodium iodide p.o.   COMPARISON:  No relevant prior studies available for comparison at this institution   FINDINGS: Planar imaging the thyroid  demonstrates normal homogeneous symmetrical uptake within the thyroid. No focal lesions identified.   4 hour I-123 uptake = 20.3% (normal 5-20%)   24 hour I-123 uptake = 45.7% (normal 10-30%)   IMPRESSION: 1. Elevated iodine uptake values at 4 hours and 24 hours. 2. No focal parenchymal abnormalities identified on imaging.     Electronically Signed   By: Sharlet Salina M.D.   On: 06/26/2021 17:26   Latest Reference Range & Units 02/17/21 09:42 04/21/21 10:16 06/05/21 08:56 06/11/21 16:30 08/26/21 13:45  TSH 0.450 - 4.500 uIU/mL  <0.01 (L) <0.01 (L)  1.290  Triiodothyronine,Free,Serum 2.0 - 4.4 pg/mL 3.9   6.6 (H) 2.1  T4,Free(Direct) 0.82 - 1.77 ng/dL 1.0   1.6 1.61 (L)  Thyroperoxidase Ab SerPl-aCnc <9 IU/mL    583 (H)   THYROID STIMULATING IMMUNOGLOBULIN     Rpt   (L): Data is abnormally low (H): Data is abnormally high Rpt: View report in Results Review for more information  Assessment & Plan:   1. Hypothyroidism-s/p RAI for Graves Disease  he is being seen at a kind request of Donita Brooks, MD.  He had RAI for Graves disease on 07/23/21.  -His recent TFTs are consistent with successful treatment, now at level where thyroid hormone replacement can be initiated.  I discussed and initiated Levothyroxine 50 mcg po daily before breakfast.  - The correct intake of thyroid hormone (Levothyroxine, Synthroid), is on empty stomach first thing in the morning, with water, separated by at least 30 minutes from breakfast and other medications,  and separated by more than 4 hours from calcium, iron, multivitamins, acid reflux medications (PPIs).  - This medication is a life-long medication and will be needed to correct thyroid hormone imbalances for the rest of your life.  The dose may change from time to time, based on thyroid blood work.  - It is extremely important to be consistent taking this medication, near the same time each morning.  -AVOID TAKING PRODUCTS CONTAINING BIOTIN  (commonly found in Hair, Skin, Nails vitamins) AS IT INTERFERES WITH THE VALIDITY OF THYROID FUNCTION BLOOD TESTS.  his history and most recent labs are reviewed, and he was examined clinically. Subjective and objective findings are consistent with thyrotoxicosis likely from primary hyperthyroidism. The potential risks of untreated thyrotoxicosis and the need for definitive therapy have been discussed in detail with him, and he agrees to proceed with diagnostic workup and treatment plan.     -Patient is advised to maintain close follow up with Donita Brooks, MD for primary care needs.   I spent 20 minutes in the care of the patient today including review of labs from Thyroid Function, CMP, and other relevant labs ; imaging/biopsy records (current and previous including abstractions from other facilities); face-to-face time discussing  his lab results and symptoms, medications doses, his options of short and long term treatment based on the latest standards of care / guidelines;   and documenting the encounter.  Timothy Jimenez  participated in the discussions, expressed understanding, and voiced agreement with the above plans.  All questions were answered to his satisfaction. he is  encouraged to contact clinic should he have any questions or concerns prior to his return visit.   Follow up plan: Return in about 8 weeks (around 10/30/2021) for Thyroid follow up, Previsit labs.   Thank you for involving me in the care of this pleasant patient, and I will continue to update you with his progress.    Ronny Bacon, [facility] [address] [place], [place] [address] Phone: [phone] Fax: [phone]  09/04/2021, 10:16 AM

## 2021-11-03 NOTE — Patient Instructions (Signed)

## 2021-11-04 ENCOUNTER — Encounter: Payer: Self-pay | Admitting: Nurse Practitioner

## 2021-11-04 ENCOUNTER — Ambulatory Visit (INDEPENDENT_AMBULATORY_CARE_PROVIDER_SITE_OTHER): Payer: Medicare Other | Admitting: Nurse Practitioner

## 2021-11-04 VITALS — BP 137/83 | HR 61 | Ht 75.0 in | Wt 247.0 lb

## 2021-11-04 DIAGNOSIS — E890 Postprocedural hypothyroidism: Secondary | ICD-10-CM | POA: Diagnosis not present

## 2021-11-04 LAB — TSH: TSH: 60.3 u[IU]/mL — ABNORMAL HIGH (ref 0.450–4.500)

## 2021-11-04 LAB — T4, FREE: Free T4: 0.57 ng/dL — ABNORMAL LOW (ref 0.82–1.77)

## 2021-11-04 MED ORDER — LEVOTHYROXINE SODIUM 100 MCG PO TABS: 50.0000 ug | ORAL_TABLET | Freq: Every day | ORAL | 1 refills | Status: DC

## 2021-11-04 NOTE — Progress Notes (Signed)
11/04/2021     Endocrinology Follow Up Note    Subjective:    Patient ID: Timothy Jimenez, male    DOB: 03-27-1951, PCP Donita Brooks, MD.   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 Ewing Schlein)    ESOPHAGOGASTRODUODENOSCOPY     medium HH esophagitis ( Dr  Ewing Schlein) 02/25/2009   TONSILLECTOMY     20 yoa    Social History   Socioeconomic History   Marital status: Married    Spouse name: [name]   Number of children: 2   Years of education: Not on file   Highest education level: Not on file  Occupational History   Not on file  Tobacco Use   Smoking status: Former    Packs/day: 0.50    Years: 3.00    Total pack years: 1.50    Types: Cigarettes    Quit date: 1977    Years since quitting: 46.6   Smokeless tobacco: Never   Tobacco comments:    stopped amoking 35 years ago  Vaping Use   Vaping Use: Never used  Substance and Sexual Activity   Alcohol use: No   Drug use: No   Sexual activity: Yes  Other Topics Concern   Not on file  Social History Narrative   Marital Status: Married 1984 LIVES WITH WIFE   Children: 2 DAUGHTERS OUT OF HOME   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   Social Determinants of Health   Financial Resource Strain: Low Risk  (06/05/2021)   Overall Financial Resource Strain (CARDIA)    Difficulty of Paying Living Expenses: Not hard at all  Food  Insecurity: No Food Insecurity (06/05/2021)   Hunger Vital Sign    Worried About Running Out of Food in the Last Year: Never true    Ran Out of Food in the Last Year: Never true  Transportation Needs: No Transportation Needs (06/05/2021)   PRAPARE - Administrator, Civil Service (Medical): No    Lack of Transportation (Non-Medical): No  Physical Activity: Insufficiently Active (06/05/2021)   Exercise Vital Sign    Days of Exercise per Week: 5 days    Minutes of Exercise per Session: 20 min  Stress: No Stress Concern Present (06/05/2021)   [employer] of Occupational Health - Occupational Stress Questionnaire    Feeling of Stress : Not at all  Social Connections: Socially Integrated (06/05/2021)   Social Connection and Isolation Panel [NHANES]    Frequency of Communication with Friends and Family: More than three times a week    Frequency of Social Gatherings with Friends and Family: More than three times a week    Attends Religious Services: More than 4 times per year    Active Member of [employer] or Organizations: Yes  Attends Engineer, structural: More than 4 times per year    Marital Status: Married    Family History  Problem Relation Age of Onset   Thyroid disease Mother    Heart failure Mother    Hypertension Mother    Stroke Mother    Kidney disease Father    Hypertension Father    Kidney failure Father    Heart attack Father    COPD Sister    Rheumatic fever Brother        age 52 h/o w heart complications   Hypertension Brother    Stroke Brother    Heart disease Brother    Liver disease Paternal Aunt    Heart disease Other        many cousins deceased MI x2   Hypertension Other        mothers side   Diabetes Other        mothers side   Lung cancer Other        paternal/maternal grandparents   Prostate cancer Neg Hx    Colon cancer Neg Hx     Outpatient Encounter Medications as of 11/04/2021  Medication Sig   doxycycline (VIBRAMYCIN) 100 MG  capsule Take 100 mg by mouth 2 (two) times daily.   levothyroxine (SYNTHROID) 100 MCG tablet Take 0.5 tablets (50 mcg total) by mouth daily before breakfast.   metroNIDAZOLE (METROCREAM) 0.75 % cream Apply topically daily.   SSD 1 % cream APPLY CREAM TOPICALLY ONCE DAILY   [DISCONTINUED] levothyroxine (SYNTHROID) 50 MCG tablet Take 1 tablet (50 mcg total) by mouth daily before breakfast.   No facility-administered encounter medications on file as of 11/04/2021.    ALLERGIES: Allergies  Allergen Reactions   Polycillin [Ampicillin]     Hives     VACCINATION STATUS: Immunization History  Administered Date(s) Administered   Fluad Quad(high Dose 65+) 02/16/2019   Influenza, High Dose Seasonal PF 01/07/2018   Influenza-Unspecified 02/18/2021   PFIZER(Purple Top)SARS-COV-2 Vaccination 11/17/2019, 12/08/2019   Pfizer Covid-19 Vaccine Bivalent Booster 72yrs & up 05/10/2020   Pneumococcal Conjugate-13 09/01/2016   Pneumococcal Polysaccharide-23 09/03/2015   Td 04/06/2001   Tdap 10/23/2011, 07/02/2018   Zoster, Live 04/06/2013     HPI  Timothy Jimenez is 71 y.o. male who presents today with a medical history as above. he is being seen in follow up after being seen in consultation for hyperthyroidism requested by Donita Brooks, MD.  He was diagnosed years ago with under-active thyroid and had been on thyroid hormone replacement up until about 12 weeks ago where he was taken off due to undetectable TSH. he has been dealing with symptoms of heat intolerance, fatigue, unintentional weight loss, anxiety, palpitation, diarrhea, and worsening hoarseness for ?the last 6 months but his wife noticed this change starting around 1 year ago. These symptoms are progressively worsening and troubling to him.  his most recent thyroid labs revealed suppressed TSH of < 0.01, FT4 of 1.6 and FT3 of 6.6 on 06/05/21.  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  he denies  dysphagia, choking, shortness of breath, but wife has noticed some recent voice change.    he does have family history of thyroid dysfunction in his mother, but denies family hx of thyroid cancer. he denies personal history of goiter. he is not currently on any anti-thyroid medications nor on any thyroid hormone supplements. Denies use of Biotin containing supplements.  he is willing to proceed with  appropriate work up and therapy for thyrotoxicosis.  Review of systems  Constitutional: + steadily increasing body weight,  current Body mass index is 30.87 kg/m. , + fatigue, no subjective hyperthermia, no subjective hypothermia Eyes: no blurry vision, no xerophthalmia ENT: no sore throat, no nodules palpated in throat, no dysphagia/odynophagia, no hoarseness Cardiovascular: no chest pain, no shortness of breath, no palpitations, no leg swelling Respiratory: no cough, no shortness of breath Gastrointestinal: no nausea/vomiting/diarrhea Musculoskeletal: + diffuse muscle/joint aches Skin: no rashes, no hyperemia Neurological: no tremors, no numbness, no tingling, no dizziness Psychiatric: no depression, no anxiety   Objective:    BP 137/83   Pulse 61   Ht 6\' 3"  (1.905 m)   Wt 247 lb (112 kg)   BMI 30.87 kg/m   Wt Readings from Last 3 Encounters:  11/04/21 247 lb (112 kg)  09/04/21 240 lb (108.9 kg)  07/10/21 237 lb 9.6 oz (107.8 kg)     BP Readings from Last 3 Encounters:  11/04/21 137/83  09/04/21 136/79  07/10/21 129/72                        Physical Exam- Limited  Constitutional:  Body mass index is 30.87 kg/m. , not in acute distress, normal state of mind Eyes:  EOMI, no exophthalmos Neck: Supple Cardiovascular: RRR, no murmurs, rubs, or gallops, no edema Respiratory: Adequate breathing efforts, no crackles, rales, rhonchi, or wheezing Musculoskeletal: no gross deformities, strength intact in all four extremities, no gross restriction of joint movements Skin:  no  rashes, no hyperemia Neurological: no tremor with outstretched hands   CMP     Component Value Date/Time   NA 141 06/11/2021 1630   K 4.1 06/11/2021 1630   CL 105 06/11/2021 1630   CO2 26 06/11/2021 1630   GLUCOSE 107 (H) 06/11/2021 1630   BUN 12 06/11/2021 1630   CREATININE 1.18 06/11/2021 1630   CALCIUM 9.2 06/11/2021 1630   PROT 6.9 06/11/2021 1630   ALBUMIN 4.2 08/07/2016 0858   AST 19 06/11/2021 1630   ALT 18 06/11/2021 1630   ALKPHOS 76 08/07/2016 0858   BILITOT 0.5 06/11/2021 1630   GFRNONAA 62 08/26/2020 1452   GFRAA 71 08/26/2020 1452     CBC    Component Value Date/Time   WBC 4.9 06/11/2021 1630   RBC 4.73 06/11/2021 1630   HGB 13.9 06/11/2021 1630   HCT 41.9 06/11/2021 1630   PLT 226 06/11/2021 1630   MCV 88.6 06/11/2021 1630   MCH 29.4 06/11/2021 1630   MCHC 33.2 06/11/2021 1630   RDW 12.7 06/11/2021 1630   LYMPHSABS 1,274 06/11/2021 1630   MONOABS 574 08/07/2016 0858   EOSABS 88 06/11/2021 1630   BASOSABS 20 06/11/2021 1630     Diabetic Labs (most recent): Lab Results  Component Value Date   HGBA1C 5.7 06/18/2011   MICROALBUR 1.6 08/05/2009    Lipid Panel     Component Value Date/Time   CHOL 138 12/16/2020 0811   TRIG 96 12/16/2020 0811   HDL 36 (L) 12/16/2020 0811   CHOLHDL 3.8 12/16/2020 0811   VLDL 11 08/07/2016 0858   LDLCALC 83 12/16/2020 0811     Lab Results  Component Value Date   TSH 60.300 (H) 11/03/2021   TSH 1.290 08/26/2021   TSH <0.01 (L) 06/05/2021   TSH <0.01 (L) 04/21/2021   TSH 0.09 (L) 02/17/2021   TSH <0.01 (L) 12/16/2020   TSH 3.09 08/26/2020   TSH  1.62 11/25/2017   TSH 2.98 08/07/2016   TSH 1.78 03/03/2016   FREET4 0.57 (L) 11/03/2021   FREET4 0.68 (L) 08/26/2021   FREET4 1.6 06/11/2021   FREET4 1.0 02/17/2021   FREET4 1.1 08/05/2009   FREET4 0.7 05/31/2006    Uptake and Scan 06/26/21  CLINICAL DATA:  Hypothyroidism, cessation of levothyroxine therapy 12 weeks ago   EXAM: THYROID SCAN AND UPTAKE  - 4 AND 24 HOURS   TECHNIQUE: Following oral administration of I-123 capsule, anterior planar imaging was acquired at 24 hours. Thyroid uptake was calculated with a thyroid probe at 4-6 hours and 24 hours.   RADIOPHARMACEUTICALS:  Four under 24 uCi I-123 sodium iodide p.o.   COMPARISON:  No relevant prior studies available for comparison at this institution   FINDINGS: Planar imaging the thyroid demonstrates normal homogeneous symmetrical uptake within the thyroid. No focal lesions identified.   4 hour I-123 uptake = 20.3% (normal 5-20%)   24 hour I-123 uptake = 45.7% (normal 10-30%)   IMPRESSION: 1. Elevated iodine uptake values at 4 hours and 24 hours. 2. No focal parenchymal abnormalities identified on imaging.     Electronically Signed   By: Sharlet Salina M.D.   On: 06/26/2021 17:26   Latest Reference Range & Units 04/21/21 10:16 06/05/21 08:56 06/11/21 16:30 08/26/21 13:45 11/03/21 09:45  TSH 0.450 - 4.500 uIU/mL <0.01 (L) <0.01 (L)  1.290 60.300 (H)  Triiodothyronine,Free,Serum 2.0 - 4.4 pg/mL   6.6 (H) 2.1   T4,Free(Direct) 0.82 - 1.77 ng/dL   1.6 0.23 (L) 3.43 (L)  Thyroperoxidase Ab SerPl-aCnc <9 IU/mL   583 (H)    THYROID STIMULATING IMMUNOGLOBULIN    Rpt    (L): Data is abnormally low (H): Data is abnormally high Rpt: View report in Results Review for more information  Assessment & Plan:   1. Hypothyroidism-s/p RAI for Graves Disease  he is being seen at a kind request of Donita Brooks, MD.  He had RAI for Graves disease on 07/23/21.  -His previsit thyroid function tests are consistent with under-replacement.  He is advised to increase his Levothyroxine to 100 mcg po daily before breakfast.     - The correct intake of thyroid hormone (Levothyroxine, Synthroid), is on empty stomach first thing in the morning, with water, separated by at least 30 minutes from breakfast and other medications,  and separated by more than 4 hours from calcium, iron,  multivitamins, acid reflux medications (PPIs).  - This medication is a life-long medication and will be needed to correct thyroid hormone imbalances for the rest of your life.  The dose may change from time to time, based on thyroid blood work.  - It is extremely important to be consistent taking this medication, near the same time each morning.  -AVOID TAKING PRODUCTS CONTAINING BIOTIN (commonly found in Hair, Skin, Nails vitamins) AS IT INTERFERES WITH THE VALIDITY OF THYROID FUNCTION BLOOD TESTS.      -Patient is advised to maintain close follow up with Donita Brooks, MD for primary care needs.   I spent 15 minutes in the care of the patient today including review of labs from Thyroid Function, CMP, and other relevant labs ; imaging/biopsy records (current and previous including abstractions from other facilities); face-to-face time discussing  his lab results and symptoms, medications doses, his options of short and long term treatment based on the latest standards of care / guidelines;   and documenting the encounter.  Timothy Jimenez  participated in  the discussions, expressed understanding, and voiced agreement with the above plans.  All questions were answered to his satisfaction. he is encouraged to contact clinic should he have any questions or concerns prior to his return visit.   Follow up plan: Return in about 2 months (around 01/04/2022) for Thyroid follow up, Previsit labs.   Thank you for involving me in the care of this pleasant patient, and I will continue to update you with his progress.  Ronny BaconWhitney Quierra Silverio, [facility] Memorial HospitalFNP-BC [facility] [address] [place], [place] [address] Phone: [phone] Fax: [phone]  11/04/2021, 8:39 AM

## 2021-12-30 LAB — TSH: TSH: 23.8 u[IU]/mL — ABNORMAL HIGH (ref 0.450–4.500)

## 2021-12-30 LAB — T4, FREE: Free T4: 1.1 ng/dL (ref 0.82–1.77)

## 2022-01-05 ENCOUNTER — Ambulatory Visit (INDEPENDENT_AMBULATORY_CARE_PROVIDER_SITE_OTHER): Payer: Medicare Other | Admitting: Nurse Practitioner

## 2022-01-05 ENCOUNTER — Encounter: Payer: Self-pay | Admitting: Nurse Practitioner

## 2022-01-05 VITALS — BP 133/75 | HR 73 | Ht 75.0 in | Wt 253.4 lb

## 2022-01-05 DIAGNOSIS — E890 Postprocedural hypothyroidism: Secondary | ICD-10-CM | POA: Diagnosis not present

## 2022-01-05 MED ORDER — LEVOTHYROXINE SODIUM 125 MCG PO TABS: 125.0000 ug | ORAL_TABLET | Freq: Every day | ORAL | 0 refills | Status: DC

## 2022-01-05 NOTE — Progress Notes (Signed)
01/05/2022     Endocrinology Follow Up Note    Subjective:    Patient ID: Timothy Jimenez, male    DOB: Aug 21, 1950, PCP Susy Frizzle, MD.   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 Watt Climes)    ESOPHAGOGASTRODUODENOSCOPY     medium HH esophagitis ( Dr  Watt Climes) 02/25/2009   TONSILLECTOMY     89 yoa    Social History   Socioeconomic History   Marital status: Married    Spouse name: [name]   Number of children: 2   Years of education: Not on file   Highest education level: Not on file  Occupational History   Not on file  Tobacco Use   Smoking status: Former    Packs/day: 0.50    Years: 3.00    Total pack years: 1.50    Types: Cigarettes    Quit date: 1977    Years since quitting: 46.7   Smokeless tobacco: Never   Tobacco comments:    stopped amoking 35 years ago  Vaping Use   Vaping Use: Never used  Substance and Sexual Activity   Alcohol use: No   Drug use: No   Sexual activity: Yes  Other Topics Concern   Not on file  Social History Narrative   Marital Status: Married [name]: 2 [place]   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   Social Determinants of Health   Financial Resource Strain: [place]  (06/05/2021)   Overall Financial Resource Strain (CARDIA)    Difficulty of Paying Living Expenses: Not hard at all  Food  Insecurity: No Food Insecurity (06/05/2021)   Hunger Vital Sign    Worried About Running Out of Food in the Last Year: Never true    [place] in the Last Year: Never true  Transportation Needs: No Transportation Needs (06/05/2021)   PRAPARE - Hydrologist (Medical): No    Lack of Transportation (Non-Medical): No  Physical Activity: Insufficiently Active (06/05/2021)   Exercise Vital Sign    Days of Exercise per Week: 5 days    Minutes of Exercise per Session: 20 min  Stress: No Stress Concern Present (06/05/2021)   [place]    Feeling of Stress : Not at all  Social Connections: [place] (06/05/2021)   Social Connection and Isolation Panel [NHANES]    Frequency of Communication with Friends and Family: More than three times a week    Frequency of Social Gatherings with Friends and Family: More than three times a week    Attends Religious Services: More than 4 times per year    Active Member of [employer] or Organizations: Yes  Attends Music therapist: More than 4 times per year    Marital Status: Married    Family History  Problem Relation Age of Onset   Thyroid disease Mother    Heart failure Mother    Hypertension Mother    Stroke Mother    Kidney disease Father    Hypertension Father    Kidney failure Father    Heart attack Father    COPD Sister    Rheumatic fever Brother        age 80 h/o w heart complications   Hypertension Brother    Stroke Brother    Heart disease Brother    Liver disease Paternal Aunt    Heart disease Other        many cousins deceased MI x2   Hypertension Other        mothers side   Diabetes Other        mothers side   Lung cancer Other        paternal/maternal grandparents   Prostate cancer Neg Hx    Colon cancer Neg Hx     Outpatient Encounter Medications as of 01/05/2022  Medication Sig   SSD 1 % cream APPLY CREAM  TOPICALLY ONCE DAILY (Patient taking differently: Apply 1 Application topically as needed.)   [DISCONTINUED] levothyroxine (SYNTHROID) 100 MCG tablet Take 0.5 tablets (50 mcg total) by mouth daily before breakfast. (Patient taking differently: Take 100 mcg by mouth daily before breakfast.)   doxycycline (VIBRAMYCIN) 100 MG capsule Take 100 mg by mouth 2 (two) times daily. (Patient not taking: Reported on 01/05/2022)   levothyroxine (SYNTHROID) 125 MCG tablet Take 1 tablet (125 mcg total) by mouth daily before breakfast.   metroNIDAZOLE (METROCREAM) 0.75 % cream Apply topically daily. (Patient not taking: Reported on 01/05/2022)   No facility-administered encounter medications on file as of 01/05/2022.    ALLERGIES: Allergies  Allergen Reactions   Polycillin [Ampicillin]     Hives     VACCINATION STATUS: Immunization History  Administered Date(s) Administered   Fluad Quad(high Dose 65+) 02/16/2019   Influenza, High Dose Seasonal PF 01/07/2018   Influenza-Unspecified 02/18/2021   PFIZER(Purple Top)SARS-COV-2 Vaccination 11/17/2019, 12/08/2019   Pfizer Covid-19 Vaccine Bivalent Booster 77yrs & up 05/10/2020   Pneumococcal Conjugate-13 09/01/2016   Pneumococcal Polysaccharide-23 09/03/2015   Td 04/06/2001   Tdap 10/23/2011, 07/02/2018   Zoster, Live 04/06/2013     HPI  Timothy Jimenez is 71 y.o. male who presents today with a medical history as above. he is being seen in follow up after being seen in consultation for hyperthyroidism requested by Susy Frizzle, MD.  He was diagnosed years ago with under-active thyroid and had been on thyroid hormone replacement up until about 12 weeks ago where he was taken off due to undetectable TSH. he has been dealing with symptoms of heat intolerance, fatigue, unintentional weight loss, anxiety, palpitation, diarrhea, and worsening hoarseness for ?the last 6 months but his wife noticed this change starting around 1 year ago. These symptoms are  progressively worsening and troubling to him.  his most recent thyroid labs revealed suppressed TSH of < 0.01, FT4 of 1.6 and FT3 of 6.6 on 06/05/21.  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  he denies dysphagia, choking, shortness of breath, but wife has noticed some recent voice change.    he does have family history of thyroid dysfunction in his mother, but denies family hx of thyroid cancer. he  denies personal history of goiter. he is not currently on any anti-thyroid medications nor on any thyroid hormone supplements. Denies use of Biotin containing supplements.  he is willing to proceed with appropriate work up and therapy for thyrotoxicosis.  Review of systems  Constitutional: + steadily increasing body weight,  current Body mass index is 31.67 kg/m. , + fatigue, no subjective hyperthermia, no subjective hypothermia Eyes: no blurry vision, no xerophthalmia ENT: no sore throat, no nodules palpated in throat, no dysphagia/odynophagia, no hoarseness Cardiovascular: no chest pain, no shortness of breath, no palpitations, no leg swelling Respiratory: no cough, no shortness of breath Gastrointestinal: no nausea/vomiting/diarrhea Musculoskeletal: + diffuse muscle/joint aches-improved slightly Skin: no rashes, no hyperemia Neurological: no tremors, no numbness, no tingling, no dizziness Psychiatric: no depression, no anxiety   Objective:    BP 133/75 (BP Location: Left Arm, Patient Position: Sitting, Cuff Size: Large)   Pulse 73   Ht 6\' 3"  (1.905 m)   Wt 253 lb 6.4 oz (114.9 kg)   BMI 31.67 kg/m   Wt Readings from Last 3 Encounters:  01/05/22 253 lb 6.4 oz (114.9 kg)  11/04/21 247 lb (112 kg)  09/04/21 240 lb (108.9 kg)     BP Readings from Last 3 Encounters:  01/05/22 133/75  11/04/21 137/83  09/04/21 136/79                         Physical Exam- Limited  Constitutional:  Body mass index is 31.67 kg/m. , not in acute distress,  normal state of mind Eyes:  EOMI, no exophthalmos Neck: Supple Cardiovascular: RRR, no murmurs, rubs, or gallops, no edema Respiratory: Adequate breathing efforts, no crackles, rales, rhonchi, or wheezing Musculoskeletal: no gross deformities, strength intact in all four extremities, no gross restriction of joint movements Skin:  no rashes, no hyperemia Neurological: no tremor with outstretched hands   CMP     Component Value Date/Time   NA 141 06/11/2021 1630   K 4.1 06/11/2021 1630   CL 105 06/11/2021 1630   CO2 26 06/11/2021 1630   GLUCOSE 107 (H) 06/11/2021 1630   BUN 12 06/11/2021 1630   CREATININE 1.18 06/11/2021 1630   CALCIUM 9.2 06/11/2021 1630   PROT 6.9 06/11/2021 1630   ALBUMIN 4.2 08/07/2016 0858   AST 19 06/11/2021 1630   ALT 18 06/11/2021 1630   ALKPHOS 76 08/07/2016 0858   BILITOT 0.5 06/11/2021 1630   GFRNONAA 62 08/26/2020 1452   GFRAA 71 08/26/2020 1452     CBC    Component Value Date/Time   WBC 4.9 06/11/2021 1630   RBC 4.73 06/11/2021 1630   HGB 13.9 06/11/2021 1630   HCT 41.9 06/11/2021 1630   PLT 226 06/11/2021 1630   MCV 88.6 06/11/2021 1630   MCH 29.4 06/11/2021 1630   MCHC 33.2 06/11/2021 1630   RDW 12.7 06/11/2021 1630   LYMPHSABS 1,274 06/11/2021 1630   MONOABS 574 08/07/2016 0858   EOSABS 88 06/11/2021 1630   BASOSABS 20 06/11/2021 1630     Diabetic Labs (most recent): Lab Results  Component Value Date   HGBA1C 5.7 06/18/2011   MICROALBUR 1.6 08/05/2009    Lipid Panel     Component Value Date/Time   CHOL 138 12/16/2020 0811   TRIG 96 12/16/2020 0811   HDL 36 (L) 12/16/2020 0811   CHOLHDL 3.8 12/16/2020 0811   VLDL 11 08/07/2016 0858   LDLCALC 83 12/16/2020 0811     Lab Results  Component Value Date   TSH 23.800 (H) 12/29/2021   TSH 60.300 (H) 11/03/2021   TSH 1.290 08/26/2021   TSH <0.01 (L) 06/05/2021   TSH <0.01 (L) 04/21/2021   TSH 0.09 (L) 02/17/2021   TSH <0.01 (L) 12/16/2020   TSH 3.09 08/26/2020   TSH  1.62 11/25/2017   TSH 2.98 08/07/2016   FREET4 1.10 12/29/2021   FREET4 0.57 (L) 11/03/2021   FREET4 0.68 (L) 08/26/2021   FREET4 1.6 06/11/2021   FREET4 1.0 02/17/2021   FREET4 1.1 08/05/2009   FREET4 0.7 05/31/2006    Uptake and Scan 06/26/21  CLINICAL DATA:  Hypothyroidism, cessation of levothyroxine therapy 12 weeks ago   EXAM: THYROID SCAN AND UPTAKE - 4 AND 24 HOURS   TECHNIQUE: Following oral administration of I-123 capsule, anterior planar imaging was acquired at 24 hours. Thyroid uptake was calculated with a thyroid probe at 4-6 hours and 24 hours.   RADIOPHARMACEUTICALS:  Four under 24 uCi I-123 sodium iodide p.o.   COMPARISON:  No relevant prior studies available for comparison at this institution   FINDINGS: Planar imaging the thyroid demonstrates normal homogeneous symmetrical uptake within the thyroid. No focal lesions identified.   4 hour I-123 uptake = 20.3% (normal 5-20%)   24 hour I-123 uptake = 45.7% (normal 10-30%)   IMPRESSION: 1. Elevated iodine uptake values at 4 hours and 24 hours. 2. No focal parenchymal abnormalities identified on imaging.     Electronically Signed   By: Randa Ngo M.D.   On: 06/26/2021 17:26   Latest Reference Range & Units 06/05/21 08:56 06/11/21 16:30 08/26/21 13:45 11/03/21 09:45 12/29/21 09:51  TSH 0.450 - 4.500 uIU/mL <0.01 (L)  1.290 60.300 (H) 23.800 (H)  Triiodothyronine,Free,Serum 2.0 - 4.4 pg/mL  6.6 (H) 2.1    T4,Free(Direct) 0.82 - 1.77 ng/dL  1.6 0.68 (L) 0.57 (L) 1.10  Thyroperoxidase Ab SerPl-aCnc <9 IU/mL  583 (H)     THYROID STIMULATING IMMUNOGLOBULIN   Rpt     (L): Data is abnormally low (H): Data is abnormally high Rpt: View report in Results Review for more information  Assessment & Plan:   1. Hypothyroidism-s/p RAI for Graves Disease  he is being seen at a kind request of Susy Frizzle, MD.  He had RAI for Graves disease on 07/23/21.  -His previsit thyroid function tests are  consistent with under-replacement.  He is advised to increase his Levothyroxine to 125 mcg po daily before breakfast.     - The correct intake of thyroid hormone (Levothyroxine, Synthroid), is on empty stomach first thing in the morning, with water, separated by at least 30 minutes from breakfast and other medications,  and separated by more than 4 hours from calcium, iron, multivitamins, acid reflux medications (PPIs).  - This medication is a life-long medication and will be needed to correct thyroid hormone imbalances for the rest of your life.  The dose may change from time to time, based on thyroid blood work.  - It is extremely important to be consistent taking this medication, near the same time each morning.  -AVOID TAKING PRODUCTS CONTAINING BIOTIN (commonly found in Hair, Skin, Nails vitamins) AS IT INTERFERES WITH THE VALIDITY OF THYROID FUNCTION BLOOD TESTS.      -Patient is advised to maintain close follow up with Susy Frizzle, MD for primary care needs.    I spent 22 minutes in the care of the patient today including review of labs from Thyroid Function, CMP, and other relevant  labs ; imaging/biopsy records (current and previous including abstractions from other facilities); face-to-face time discussing  his lab results and symptoms, medications doses, his options of short and long term treatment based on the latest standards of care / guidelines;   and documenting the encounter.  Timothy Jimenez  participated in the discussions, expressed understanding, and voiced agreement with the above plans.  All questions were answered to his satisfaction. he is encouraged to contact clinic should he have any questions or concerns prior to his return visit.   Follow up plan: Return in about 2 months (around 03/07/2022) for Thyroid follow up, Previsit labs.   Thank you for involving me in the care of this pleasant patient, and I will continue to update you with his  progress.  Rayetta Pigg, [facility] [address] [place], [place] [address] Phone: [phone] Fax: [phone]  01/05/2022, 10:07 AM

## 2022-01-05 NOTE — Patient Instructions (Signed)

## 2022-02-12 ENCOUNTER — Ambulatory Visit (INDEPENDENT_AMBULATORY_CARE_PROVIDER_SITE_OTHER): Payer: Medicare Other

## 2022-02-12 DIAGNOSIS — Z23 Encounter for immunization: Secondary | ICD-10-CM

## 2022-03-03 LAB — TSH: TSH: 15.1 u[IU]/mL — ABNORMAL HIGH (ref 0.450–4.500)

## 2022-03-03 LAB — T4, FREE: Free T4: 1.21 ng/dL (ref 0.82–1.77)

## 2022-03-05 NOTE — Patient Instructions (Signed)

## 2022-03-06 ENCOUNTER — Ambulatory Visit (INDEPENDENT_AMBULATORY_CARE_PROVIDER_SITE_OTHER): Payer: Medicare Other | Admitting: Nurse Practitioner

## 2022-03-06 ENCOUNTER — Encounter: Payer: Self-pay | Admitting: Nurse Practitioner

## 2022-03-06 VITALS — BP 153/76 | HR 67 | Ht 75.0 in | Wt 253.0 lb

## 2022-03-06 DIAGNOSIS — E890 Postprocedural hypothyroidism: Secondary | ICD-10-CM

## 2022-03-06 MED ORDER — LEVOTHYROXINE SODIUM 137 MCG PO TABS: 137.0000 ug | ORAL_TABLET | Freq: Every day | ORAL | 1 refills | Status: DC

## 2022-03-06 NOTE — Progress Notes (Signed)
03/06/2022     Endocrinology Follow Up Note    Subjective:    Patient ID: Timothy Jimenez, male    DOB: 30-Jun-1950, PCP Donita Brooks, MD.   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 Ewing Schlein)    ESOPHAGOGASTRODUODENOSCOPY     medium HH esophagitis ( Dr  Ewing Schlein) 02/25/2009   TONSILLECTOMY     20 yoa    Social History   Socioeconomic History   Marital status: Married    Spouse name: [name]   Number of children: 2   Years of education: Not on file   Highest education level: Not on file  Occupational History   Not on file  Tobacco Use   Smoking status: Former    Packs/day: 0.50    Years: 3.00    Total pack years: 1.50    Types: Cigarettes    Quit date: 1977    Years since quitting: 46.9   Smokeless tobacco: Never   Tobacco comments:    stopped amoking 35 years ago  Vaping Use   Vaping Use: Never used  Substance and Sexual Activity   Alcohol use: No   Drug use: No   Sexual activity: Yes  Other Topics Concern   Not on file  Social History Narrative   Marital Status: Married 1984 LIVES WITH WIFE   Children: 2 DAUGHTERS OUT OF HOME   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   Social Determinants of Health   Financial Resource Strain: Low Risk  (06/05/2021)   Overall Financial Resource Strain (CARDIA)    Difficulty of Paying Living Expenses: Not hard at all  Food  Insecurity: No Food Insecurity (06/05/2021)   Hunger Vital Sign    Worried About Running Out of Food in the Last Year: Never true    Ran Out of Food in the Last Year: Never true  Transportation Needs: No Transportation Needs (06/05/2021)   PRAPARE - Administrator, Civil Service (Medical): No    Lack of Transportation (Non-Medical): No  Physical Activity: Insufficiently Active (06/05/2021)   Exercise Vital Sign    Days of Exercise per Week: 5 days    Minutes of Exercise per Session: 20 min  Stress: No Stress Concern Present (06/05/2021)   [employer] of Occupational Health - Occupational Stress Questionnaire    Feeling of Stress : Not at all  Social Connections: Socially Integrated (06/05/2021)   Social Connection and Isolation Panel [NHANES]    Frequency of Communication with Friends and Family: More than three times a week    Frequency of Social Gatherings with Friends and Family: More than three times a week    Attends Religious Services: More than 4 times per year    Active Member of [employer] or Organizations: Yes  Attends Engineer, structural: More than 4 times per year    Marital Status: Married    Family History  Problem Relation Age of Onset   Thyroid disease Mother    Heart failure Mother    Hypertension Mother    Stroke Mother    Kidney disease Father    Hypertension Father    Kidney failure Father    Heart attack Father    COPD Sister    Rheumatic fever Brother        age 65 h/o w heart complications   Hypertension Brother    Stroke Brother    Heart disease Brother    Liver disease Paternal Aunt    Heart disease Other        many cousins deceased MI x2   Hypertension Other        mothers side   Diabetes Other        mothers side   Lung cancer Other        paternal/maternal grandparents   Prostate cancer Neg Hx    Colon cancer Neg Hx     Outpatient Encounter Medications as of 03/06/2022  Medication Sig   pantoprazole (PROTONIX) 40 MG  tablet Take 40 mg by mouth daily.   SSD 1 % cream APPLY CREAM TOPICALLY ONCE DAILY (Patient taking differently: Apply 1 Application topically as needed.)   [DISCONTINUED] levothyroxine (SYNTHROID) 125 MCG tablet Take 1 tablet (125 mcg total) by mouth daily before breakfast.   doxycycline (VIBRAMYCIN) 100 MG capsule Take 100 mg by mouth 2 (two) times daily. (Patient not taking: Reported on 01/05/2022)   levothyroxine (SYNTHROID) 137 MCG tablet Take 1 tablet (137 mcg total) by mouth daily before breakfast.   metroNIDAZOLE (METROCREAM) 0.75 % cream Apply topically daily. (Patient not taking: Reported on 01/05/2022)   No facility-administered encounter medications on file as of 03/06/2022.    ALLERGIES: Allergies  Allergen Reactions   Polycillin [Ampicillin]     Hives     VACCINATION STATUS: Immunization History  Administered Date(s) Administered   Fluad Quad(high Dose 65+) 02/16/2019, 02/12/2022   Influenza, High Dose Seasonal PF 01/07/2018   Influenza-Unspecified 02/18/2021   PFIZER(Purple Top)SARS-COV-2 Vaccination 11/17/2019, 12/08/2019   Pfizer Covid-19 Vaccine Bivalent Booster 62yrs & up 05/10/2020   Pneumococcal Conjugate-13 09/01/2016   Pneumococcal Polysaccharide-23 09/03/2015   Td 04/06/2001   Tdap 10/23/2011, 07/02/2018   Zoster, Live 04/06/2013     HPI  Timothy Jimenez is 71 y.o. male who presents today with a medical history as above. he is being seen in follow up after being seen in consultation for hyperthyroidism requested by Donita Brooks, MD.  He was diagnosed years ago with under-active thyroid and had been on thyroid hormone replacement up until about 12 weeks ago where he was taken off due to undetectable TSH. he has been dealing with symptoms of heat intolerance, fatigue, unintentional weight loss, anxiety, palpitation, diarrhea, and worsening hoarseness for ?the last 6 months but his wife noticed this change starting around 1 year ago. These symptoms are  progressively worsening and troubling to him.  his most recent thyroid labs revealed suppressed TSH of < 0.01, FT4 of 1.6 and FT3 of 6.6 on 06/05/21.  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  he denies dysphagia, choking, shortness of breath, but wife has noticed some recent voice change.    he does have family history of thyroid dysfunction in his mother, but denies family hx of  thyroid cancer. he denies personal history of goiter. he is not currently on any anti-thyroid medications nor on any thyroid hormone supplements. Denies use of Biotin containing supplements.  he is willing to proceed with appropriate work up and therapy for thyrotoxicosis.  Review of systems  Constitutional: + stable body weight,  current Body mass index is 31.62 kg/m. , + fatigue, no subjective hyperthermia, no subjective hypothermia Eyes: no blurry vision, no xerophthalmia ENT: no sore throat, no nodules palpated in throat, no dysphagia/odynophagia, no hoarseness Cardiovascular: no chest pain, no shortness of breath, no palpitations, no leg swelling Respiratory: no cough, no shortness of breath Gastrointestinal: no nausea/vomiting/diarrhea Musculoskeletal: + diffuse muscle/joint aches-improved slightly Skin: no rashes, no hyperemia Neurological: no tremors, no numbness, no tingling, no dizziness Psychiatric: no depression, no anxiety   Objective:    BP (!) 153/76 (BP Location: Left Arm, Patient Position: Sitting, Cuff Size: Large)   Pulse 67   Ht 6\' 3"  (1.905 m)   Wt 253 lb (114.8 kg)   BMI 31.62 kg/m   Wt Readings from Last 3 Encounters:  03/06/22 253 lb (114.8 kg)  01/05/22 253 lb 6.4 oz (114.9 kg)  11/04/21 247 lb (112 kg)     BP Readings from Last 3 Encounters:  03/06/22 (!) 153/76  01/05/22 133/75  11/04/21 137/83                         Physical Exam- Limited  Constitutional:  Body mass index is 31.62 kg/m. , not in acute distress, normal state  of mind Eyes:  EOMI, no exophthalmos Musculoskeletal: no gross deformities, strength intact in all four extremities, no gross restriction of joint movements Skin:  no rashes, no hyperemia Neurological: no tremor with outstretched hands   CMP     Component Value Date/Time   NA 141 06/11/2021 1630   K 4.1 06/11/2021 1630   CL 105 06/11/2021 1630   CO2 26 06/11/2021 1630   GLUCOSE 107 (H) 06/11/2021 1630   BUN 12 06/11/2021 1630   CREATININE 1.18 06/11/2021 1630   CALCIUM 9.2 06/11/2021 1630   PROT 6.9 06/11/2021 1630   ALBUMIN 4.2 08/07/2016 0858   AST 19 06/11/2021 1630   ALT 18 06/11/2021 1630   ALKPHOS 76 08/07/2016 0858   BILITOT 0.5 06/11/2021 1630   GFRNONAA 62 08/26/2020 1452   GFRAA 71 08/26/2020 1452     CBC    Component Value Date/Time   WBC 4.9 06/11/2021 1630   RBC 4.73 06/11/2021 1630   HGB 13.9 06/11/2021 1630   HCT 41.9 06/11/2021 1630   PLT 226 06/11/2021 1630   MCV 88.6 06/11/2021 1630   MCH 29.4 06/11/2021 1630   MCHC 33.2 06/11/2021 1630   RDW 12.7 06/11/2021 1630   LYMPHSABS 1,274 06/11/2021 1630   MONOABS 574 08/07/2016 0858   EOSABS 88 06/11/2021 1630   BASOSABS 20 06/11/2021 1630     Diabetic Labs (most recent): Lab Results  Component Value Date   HGBA1C 5.7 06/18/2011   MICROALBUR 1.6 08/05/2009    Lipid Panel     Component Value Date/Time   CHOL 138 12/16/2020 0811   TRIG 96 12/16/2020 0811   HDL 36 (L) 12/16/2020 0811   CHOLHDL 3.8 12/16/2020 0811   VLDL 11 08/07/2016 0858   LDLCALC 83 12/16/2020 0811     Lab Results  Component Value Date   TSH 15.100 (H) 03/02/2022   TSH 23.800 (H) 12/29/2021   TSH 60.300 (  H) 11/03/2021   TSH 1.290 08/26/2021   TSH <0.01 (L) 06/05/2021   TSH <0.01 (L) 04/21/2021   TSH 0.09 (L) 02/17/2021   TSH <0.01 (L) 12/16/2020   TSH 3.09 08/26/2020   TSH 1.62 11/25/2017   FREET4 1.21 03/02/2022   FREET4 1.10 12/29/2021   FREET4 0.57 (L) 11/03/2021   FREET4 0.68 (L) 08/26/2021   FREET4 1.6  06/11/2021   FREET4 1.0 02/17/2021   FREET4 1.1 08/05/2009   FREET4 0.7 05/31/2006    Uptake and Scan 06/26/21  CLINICAL DATA:  Hypothyroidism, cessation of levothyroxine therapy 12 weeks ago   EXAM: THYROID SCAN AND UPTAKE - 4 AND 24 HOURS   TECHNIQUE: Following oral administration of I-123 capsule, anterior planar imaging was acquired at 24 hours. Thyroid uptake was calculated with a thyroid probe at 4-6 hours and 24 hours.   RADIOPHARMACEUTICALS:  Four under 24 uCi I-123 sodium iodide p.o.   COMPARISON:  No relevant prior studies available for comparison at this institution   FINDINGS: Planar imaging the thyroid demonstrates normal homogeneous symmetrical uptake within the thyroid. No focal lesions identified.   4 hour I-123 uptake = 20.3% (normal 5-20%)   24 hour I-123 uptake = 45.7% (normal 10-30%)   IMPRESSION: 1. Elevated iodine uptake values at 4 hours and 24 hours. 2. No focal parenchymal abnormalities identified on imaging.     Electronically Signed   By: Sharlet Salina M.D.   On: 06/26/2021 17:26   Latest Reference Range & Units 06/05/21 08:56 06/11/21 16:30 08/26/21 13:45 11/03/21 09:45 12/29/21 09:51 03/02/22 11:44  TSH 0.450 - 4.500 uIU/mL <0.01 (L)  1.290 60.300 (H) 23.800 (H) 15.100 (H)  Triiodothyronine,Free,Serum 2.0 - 4.4 pg/mL  6.6 (H) 2.1     T4,Free(Direct) 0.82 - 1.77 ng/dL  1.6 7.62 (L) 8.31 (L) 1.10 1.21  Thyroperoxidase Ab SerPl-aCnc <9 IU/mL  583 (H)      THYROID STIMULATING IMMUNOGLOBULIN   Rpt      (L): Data is abnormally low (H): Data is abnormally high Rpt: View report in Results Review for more information  Assessment & Plan:   1. Hypothyroidism-s/p RAI for Jimenez Disease  he is being seen at a kind request of Donita Brooks, MD.  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  -His previsit thyroid function tests are consistent with under-replacement.  He is advised to increase his Levothyroxine to 137 mcg po daily before  breakfast.     - The correct intake of thyroid hormone (Levothyroxine, Synthroid), is on empty stomach first thing in the morning, with water, separated by at least 30 minutes from breakfast and other medications,  and separated by more than 4 hours from calcium, iron, multivitamins, acid reflux medications (PPIs).  - This medication is a life-long medication and will be needed to correct thyroid hormone imbalances for the rest of your life.  The dose may change from time to time, based on thyroid blood work.  - It is extremely important to be consistent taking this medication, near the same time each morning.  -AVOID TAKING PRODUCTS CONTAINING BIOTIN (commonly found in Hair, Skin, Nails vitamins) AS IT INTERFERES WITH THE VALIDITY OF THYROID FUNCTION BLOOD TESTS.      -Patient is advised to maintain close follow up with Donita Brooks, MD for primary care needs.    I spent 35 minutes in the care of the patient today including review of labs from Thyroid Function, CMP, and other relevant labs ; imaging/biopsy records (current  and previous including abstractions from other facilities); face-to-face time discussing  his lab results and symptoms, medications doses, his options of short and long term treatment based on the latest standards of care / guidelines;   and documenting the encounter.  Timothy Jimenez  participated in the discussions, expressed understanding, and voiced agreement with the above plans.  All questions were answered to his satisfaction. he is encouraged to contact clinic should he have any questions or concerns prior to his return visit.   Follow up plan: Return in about 2 months (around 05/07/2022) for Thyroid follow up, Previsit labs.   Thank you for involving me in the care of this pleasant patient, and I will continue to update you with his progress.  Ronny Bacon, [facility] [address] [place], [place] [address] Phone:  [phone] Fax: [phone]  03/06/2022, 10:59 AM

## 2022-05-01 LAB — T4, FREE: Free T4: 1.28 ng/dL (ref 0.82–1.77)

## 2022-05-01 LAB — TSH: TSH: 14.1 u[IU]/mL — ABNORMAL HIGH (ref 0.450–4.500)

## 2022-05-07 ENCOUNTER — Ambulatory Visit (INDEPENDENT_AMBULATORY_CARE_PROVIDER_SITE_OTHER): Payer: Medicare Other | Admitting: Nurse Practitioner

## 2022-05-07 ENCOUNTER — Encounter: Payer: Self-pay | Admitting: Nurse Practitioner

## 2022-05-07 VITALS — BP 130/76 | HR 78 | Ht 75.0 in | Wt 260.0 lb

## 2022-05-07 DIAGNOSIS — E890 Postprocedural hypothyroidism: Secondary | ICD-10-CM

## 2022-05-07 MED ORDER — LEVOTHYROXINE SODIUM 150 MCG PO TABS: 150.0000 ug | ORAL_TABLET | Freq: Every day | ORAL | 1 refills | Status: DC

## 2022-05-07 NOTE — Progress Notes (Signed)
05/07/2022     Endocrinology Follow Up Note    Subjective:    Patient ID: Timothy Jimenez, male    DOB: 09-24-1950, PCP Susy Frizzle, MD.   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 Watt Climes)    ESOPHAGOGASTRODUODENOSCOPY     medium HH esophagitis ( Dr  Watt Climes) 02/25/2009   TONSILLECTOMY     25 yoa    Social History   Socioeconomic History   Marital status: Married    Spouse name: [name]   Number of children: 2   Years of education: Not on file   Highest education level: Not on file  Occupational History   Not on file  Tobacco Use   Smoking status: Former    Packs/day: 0.50    Years: 3.00    Total pack years: 1.50    Types: Cigarettes    Quit date: 1977    Years since quitting: 47.1   Smokeless tobacco: Never   Tobacco comments:    stopped amoking 35 years ago  Vaping Use   Vaping Use: Never used  Substance and Sexual Activity   Alcohol use: No   Drug use: No   Sexual activity: Yes  Other Topics Concern   Not on file  Social History Narrative   Marital Status: Married [place]: 2 [place]   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   Social Determinants of Health   Financial Resource Strain: [place]  (06/05/2021)   Overall Financial Resource Strain (CARDIA)    Difficulty of Paying Living Expenses: Not hard at all  Food  Insecurity: No Food Insecurity (06/05/2021)   Hunger Vital Sign    Worried About Running Out of Food in the Last Year: Never true    [place] in the Last Year: Never true  Transportation Needs: No Transportation Needs (06/05/2021)   PRAPARE - Hydrologist (Medical): No    Lack of Transportation (Non-Medical): No  Physical Activity: Insufficiently Active (06/05/2021)   Exercise Vital Sign    Days of Exercise per Week: 5 days    Minutes of Exercise per Session: 20 min  Stress: No Stress Concern Present (06/05/2021)   [place]    Feeling of Stress : Not at all  Social Connections: [place] (06/05/2021)   Social Connection and Isolation Panel [NHANES]    Frequency of Communication with Friends and Family: More than three times a week    Frequency of Social Gatherings with Friends and Family: More than three times a week    Attends Religious Services: More than 4 times per year    Active Member of [employer] or Organizations: Yes  Attends Music therapist: More than 4 times per year    Marital Status: Married    Family History  Problem Relation Age of Onset   Thyroid disease Mother    Heart failure Mother    Hypertension Mother    Stroke Mother    Kidney disease Father    Hypertension Father    Kidney failure Father    Heart attack Father    COPD Sister    Rheumatic fever Brother        age 31 h/o w heart complications   Hypertension Brother    Stroke Brother    Heart disease Brother    Liver disease Paternal Aunt    Heart disease Other        many cousins deceased MI x2   Hypertension Other        mothers side   Diabetes Other        mothers side   Lung cancer Other        paternal/maternal grandparents   Prostate cancer Neg Hx    Colon cancer Neg Hx     Outpatient Encounter Medications as of 05/07/2022  Medication Sig   pantoprazole (PROTONIX) 40 MG  tablet Take 40 mg by mouth daily.   SSD 1 % cream APPLY CREAM TOPICALLY ONCE DAILY (Patient taking differently: Apply 1 Application topically as needed.)   [DISCONTINUED] levothyroxine (SYNTHROID) 137 MCG tablet Take 1 tablet (137 mcg total) by mouth daily before breakfast.   levothyroxine (SYNTHROID) 150 MCG tablet Take 1 tablet (150 mcg total) by mouth daily before breakfast.   [DISCONTINUED] doxycycline (VIBRAMYCIN) 100 MG capsule Take 100 mg by mouth 2 (two) times daily. (Patient not taking: Reported on 01/05/2022)   [DISCONTINUED] metroNIDAZOLE (METROCREAM) 0.75 % cream Apply topically daily. (Patient not taking: Reported on 01/05/2022)   No facility-administered encounter medications on file as of 05/07/2022.    ALLERGIES: Allergies  Allergen Reactions   Polycillin [Ampicillin]     Hives     VACCINATION STATUS: Immunization History  Administered Date(s) Administered   Fluad Quad(high Dose 65+) 02/16/2019, 02/12/2022   Influenza, High Dose Seasonal PF 01/07/2018   Influenza-Unspecified 02/18/2021   PFIZER(Purple Top)SARS-COV-2 Vaccination 11/17/2019, 12/08/2019   Pfizer Covid-19 Vaccine Bivalent Booster 70yrs & up 05/10/2020   Pneumococcal Conjugate-13 09/01/2016   Pneumococcal Polysaccharide-23 09/03/2015   Td 04/06/2001   Tdap 10/23/2011, 07/02/2018   Zoster, Live 04/06/2013     HPI  Timothy Jimenez is 72 y.o. male who presents today with a medical history as above. he is being seen in follow up after being seen in consultation for hyperthyroidism requested by Susy Frizzle, MD.  He was diagnosed years ago with under-active thyroid and had been on thyroid hormone replacement up until about 12 weeks ago where he was taken off due to undetectable TSH. he has been dealing with symptoms of heat intolerance, fatigue, unintentional weight loss, anxiety, palpitation, diarrhea, and worsening hoarseness for ?the last 6 months but his wife noticed this change starting around 1 year  ago. These symptoms are progressively worsening and troubling to him.   He had positive TPO antibodies at 89, indicating autoimmune thyroid dysfunction.  he denies dysphagia, choking, shortness of breath, but wife has noticed some recent voice change.    he does have family history of thyroid dysfunction in his mother, but denies family hx of thyroid cancer. he denies personal history of goiter. he is not currently on any anti-thyroid medications nor on any  thyroid hormone supplements. Denies use of Biotin containing supplements.  He had RAI for Graves disease on 07/23/21.  Review of systems  Constitutional: + increasing body weight,  current Body mass index is 32.5 kg/m. , + fatigue, no subjective hyperthermia, no subjective hypothermia Eyes: no blurry vision, no xerophthalmia ENT: no sore throat, no nodules palpated in throat, no dysphagia/odynophagia, no hoarseness Cardiovascular: no chest pain, no shortness of breath, no palpitations, no leg swelling Respiratory: no cough, no shortness of breath Gastrointestinal: no nausea/vomiting/diarrhea Musculoskeletal: + diffuse muscle/joint aches-stable Skin: no rashes, no hyperemia Neurological: no tremors, no numbness, no tingling, no dizziness Psychiatric: no depression, no anxiety   Objective:    BP 130/76 Comment: Taken with manuel Cuff  Pulse 78   Ht 6\' 3"  (1.905 m)   Wt 260 lb (117.9 kg)   BMI 32.50 kg/m   Wt Readings from Last 3 Encounters:  05/07/22 260 lb (117.9 kg)  03/06/22 253 lb (114.8 kg)  01/05/22 253 lb 6.4 oz (114.9 kg)     BP Readings from Last 3 Encounters:  05/07/22 130/76  03/06/22 (!) 153/76  01/05/22 133/75                        Physical Exam- Limited  Constitutional:  Body mass index is 32.5 kg/m. , not in acute distress, normal state of mind Eyes:  EOMI, no exophthalmos Musculoskeletal: no gross deformities, strength intact in all four extremities, no gross restriction of joint movements Skin:  no  rashes, no hyperemia Neurological: no tremor with outstretched hands   CMP     Component Value Date/Time   NA 141 06/11/2021 1630   K 4.1 06/11/2021 1630   CL 105 06/11/2021 1630   CO2 26 06/11/2021 1630   GLUCOSE 107 (H) 06/11/2021 1630   BUN 12 06/11/2021 1630   CREATININE 1.18 06/11/2021 1630   CALCIUM 9.2 06/11/2021 1630   PROT 6.9 06/11/2021 1630   ALBUMIN 4.2 08/07/2016 0858   AST 19 06/11/2021 1630   ALT 18 06/11/2021 1630   ALKPHOS 76 08/07/2016 0858   BILITOT 0.5 06/11/2021 1630   GFRNONAA 62 08/26/2020 1452   GFRAA 71 08/26/2020 1452     CBC    Component Value Date/Time   WBC 4.9 06/11/2021 1630   RBC 4.73 06/11/2021 1630   HGB 13.9 06/11/2021 1630   HCT 41.9 06/11/2021 1630   PLT 226 06/11/2021 1630   MCV 88.6 06/11/2021 1630   MCH 29.4 06/11/2021 1630   MCHC 33.2 06/11/2021 1630   RDW 12.7 06/11/2021 1630   LYMPHSABS 1,274 06/11/2021 1630   MONOABS 574 08/07/2016 0858   EOSABS 88 06/11/2021 1630   BASOSABS 20 06/11/2021 1630     Diabetic Labs (most recent): Lab Results  Component Value Date   HGBA1C 5.7 06/18/2011   MICROALBUR 1.6 08/05/2009    Lipid Panel     Component Value Date/Time   CHOL 138 12/16/2020 0811   TRIG 96 12/16/2020 0811   HDL 36 (L) 12/16/2020 0811   CHOLHDL 3.8 12/16/2020 0811   VLDL 11 08/07/2016 0858   LDLCALC 83 12/16/2020 0811     Lab Results  Component Value Date   TSH 14.100 (H) 04/30/2022   TSH 15.100 (H) 03/02/2022   TSH 23.800 (H) 12/29/2021   TSH 60.300 (H) 11/03/2021   TSH 1.290 08/26/2021   TSH <0.01 (L) 06/05/2021   TSH <0.01 (L) 04/21/2021   TSH 0.09 (L) 02/17/2021  TSH <0.01 (L) 12/16/2020   TSH 3.09 08/26/2020   FREET4 1.28 04/30/2022   FREET4 1.21 03/02/2022   FREET4 1.10 12/29/2021   FREET4 0.57 (L) 11/03/2021   FREET4 0.68 (L) 08/26/2021   FREET4 1.6 06/11/2021   FREET4 1.0 02/17/2021   FREET4 1.1 08/05/2009   FREET4 0.7 05/31/2006    Uptake and Scan 06/26/21  CLINICAL DATA:   Hypothyroidism, cessation of levothyroxine therapy 12 weeks ago   EXAM: THYROID SCAN AND UPTAKE - 4 AND 24 HOURS   TECHNIQUE: Following oral administration of I-123 capsule, anterior planar imaging was acquired at 24 hours. Thyroid uptake was calculated with a thyroid probe at 4-6 hours and 24 hours.   RADIOPHARMACEUTICALS:  Four under 24 uCi I-123 sodium iodide p.o.   COMPARISON:  No relevant prior studies available for comparison at this institution   FINDINGS: Planar imaging the thyroid demonstrates normal homogeneous symmetrical uptake within the thyroid. No focal lesions identified.   4 hour I-123 uptake = 20.3% (normal 5-20%)   24 hour I-123 uptake = 45.7% (normal 10-30%)   IMPRESSION: 1. Elevated iodine uptake values at 4 hours and 24 hours. 2. No focal parenchymal abnormalities identified on imaging.     Electronically Signed   By: Sharlet Salina M.D.   On: 06/26/2021 17:26   Latest Reference Range & Units 11/03/21 09:45 12/29/21 09:51 03/02/22 11:44 04/30/22 11:24  TSH 0.450 - 4.500 uIU/mL 60.300 (H) 23.800 (H) 15.100 (H) 14.100 (H)  T4,Free(Direct) 0.82 - 1.77 ng/dL 9.62 (L) 9.52 8.41 3.24  (H): Data is abnormally high (L): Data is abnormally low  Assessment & Plan:   1. Hypothyroidism-s/p RAI for Graves Disease  he is being seen at a kind request of Donita Brooks, MD.  He had RAI for Graves disease on 07/23/21.  -His previsit thyroid function tests are consistent with under-replacement.  He is advised to increase his Levothyroxine to 150 mcg po daily before breakfast.     - The correct intake of thyroid hormone (Levothyroxine, Synthroid), is on empty stomach first thing in the morning, with water, separated by at least 30 minutes from breakfast and other medications,  and separated by more than 4 hours from calcium, iron, multivitamins, acid reflux medications (PPIs).  - This medication is a life-long medication and will be needed to correct thyroid  hormone imbalances for the rest of your life.  The dose may change from time to time, based on thyroid blood work.  - It is extremely important to be consistent taking this medication, near the same time each morning.  -AVOID TAKING PRODUCTS CONTAINING BIOTIN (commonly found in Hair, Skin, Nails vitamins) AS IT INTERFERES WITH THE VALIDITY OF THYROID FUNCTION BLOOD TESTS.      -Patient is advised to maintain close follow up with Donita Brooks, MD for primary care needs.     I spent  12  minutes in the care of the patient today including review of labs from Thyroid Function, CMP, and other relevant labs ; imaging/biopsy records (current and previous including abstractions from other facilities); face-to-face time discussing  his lab results and symptoms, medications doses, his options of short and long term treatment based on the latest standards of care / guidelines;   and documenting the encounter.  [name]  participated in the discussions, expressed understanding, and voiced agreement with the above plans.  All questions were answered to his satisfaction. he is encouraged to contact clinic should he have any questions or  concerns prior to his return visit.   Follow up plan: Return in about 2 months (around 07/06/2022) for Thyroid follow up, Previsit labs.   Thank you for involving me in the care of this pleasant patient, and I will continue to update you with his progress.  Rayetta Pigg, [facility] [address] [place], [place] [address] Phone: [phone] Fax: [phone]  05/07/2022, 10:05 AM

## 2022-05-07 NOTE — Patient Instructions (Signed)

## 2022-06-10 NOTE — Patient Instructions (Signed)
Timothy Jimenez , Thank you for taking time to come for your Medicare Wellness Visit. I appreciate your ongoing commitment to your health goals. Please review the following plan we discussed and let me know if I can assist you in the future.   These are the goals we discussed:  Goals      Exercise 150 min/wk Moderate Activity     Increase exercise. Plant a garden.        This is a list of the screening recommended for you and due dates:  Health Maintenance  Topic Date Due   Zoster (Shingles) Vaccine (1 of 2) Never done   COVID-19 Vaccine (4 - 2023-24 season) 12/05/2021   Medicare Annual Wellness Visit  06/06/2022   DTaP/Tdap/Td vaccine (4 - Td or Tdap) 07/01/2028   Colon Cancer Screening  07/06/2030   Pneumonia Vaccine  Completed   Flu Shot  Completed   Hepatitis C Screening: USPSTF Recommendation to screen - Ages 18-79 yo.  Completed   HPV Vaccine  Aged Out    Advanced directives: Advance directive discussed with you today. I have provided a copy for you to complete at home and have notarized. Once this is complete please bring a copy in to our office so we can scan it into your chart.   Conditions/risks identified: Aim for 30 minutes of exercise or brisk walking, 6-8 glasses of water, and 5 servings of fruits and vegetables each day.   Next appointment: Follow up in one year for your annual wellness visit.   Preventive Care 19 Years and Older, Male  Preventive care refers to lifestyle choices and visits with your health care provider that can promote health and wellness. What does preventive care include? A yearly physical exam. This is also called an annual well check. Dental exams once or twice a year. Routine eye exams. Ask your health care provider how often you should have your eyes checked. Personal lifestyle choices, including: Daily care of your teeth and gums. Regular physical activity. Eating a healthy diet. Avoiding tobacco and drug use. Limiting alcohol  use. Practicing safe sex. Taking low doses of aspirin every day. Taking vitamin and mineral supplements as recommended by your health care provider. What happens during an annual well check? The services and screenings done by your health care provider during your annual well check will depend on your age, overall health, lifestyle risk factors, and family history of disease. Counseling  Your health care provider may ask you questions about your: Alcohol use. Tobacco use. Drug use. Emotional well-being. Home and relationship well-being. Sexual activity. Eating habits. History of falls. Memory and ability to understand (cognition). Work and work Statistician. Screening  You may have the following tests or measurements: Height, weight, and BMI. Blood pressure. Lipid and cholesterol levels. These may be checked every 5 years, or more frequently if you are over 4 years old. Skin check. Lung cancer screening. You may have this screening every year starting at age 85 if you have a 30-pack-year history of smoking and currently smoke or have quit within the past 15 years. Fecal occult blood test (FOBT) of the stool. You may have this test every year starting at age 29. Flexible sigmoidoscopy or colonoscopy. You may have a sigmoidoscopy every 5 years or a colonoscopy every 10 years starting at age 79. Prostate cancer screening. Recommendations will vary depending on your family history and other risks. Hepatitis C blood test. Hepatitis B blood test. Sexually transmitted disease (STD) testing. Diabetes screening.  This is done by checking your blood sugar (glucose) after you have not eaten for a while (fasting). You may have this done every 1-3 years. Abdominal aortic aneurysm (AAA) screening. You may need this if you are a current or former smoker. Osteoporosis. You may be screened starting at age 31 if you are at high risk. Talk with your health care provider about your test results,  treatment options, and if necessary, the need for more tests. Vaccines  Your health care provider may recommend certain vaccines, such as: Influenza vaccine. This is recommended every year. Tetanus, diphtheria, and acellular pertussis (Tdap, Td) vaccine. You may need a Td booster every 10 years. Zoster vaccine. You may need this after age 5. Pneumococcal 13-valent conjugate (PCV13) vaccine. One dose is recommended after age 1. Pneumococcal polysaccharide (PPSV23) vaccine. One dose is recommended after age 49. Talk to your health care provider about which screenings and vaccines you need and how often you need them. This information is not intended to replace advice given to you by your health care provider. Make sure you discuss any questions you have with your health care provider. Document Released: 04/19/2015 Document Revised: 12/11/2015 Document Reviewed: 01/22/2015 Elsevier Interactive Patient Education  2017 [place] Prevention in the Home Falls can cause injuries. They can happen to people of all ages. There are many things you can do to make your home safe and to help prevent falls. What can I do on the outside of my home? Regularly fix the edges of walkways and driveways and fix any cracks. Remove anything that might make you trip as you walk through a door, such as a raised step or threshold. Trim any bushes or trees on the path to your home. Use bright outdoor lighting. Clear any walking paths of anything that might make someone trip, such as rocks or tools. Regularly check to see if handrails are loose or broken. Make sure that both sides of any steps have handrails. Any raised decks and porches should have guardrails on the edges. Have any leaves, snow, or ice cleared regularly. Use sand or salt on walking paths during winter. Clean up any spills in your garage right away. This includes oil or grease spills. What can I do in the bathroom? Use night  lights. Install grab bars by the toilet and in the tub and shower. Do not use towel bars as grab bars. Use non-skid mats or decals in the tub or shower. If you need to sit down in the shower, use a plastic, non-slip stool. Keep the floor dry. Clean up any water that spills on the floor as soon as it happens. Remove soap buildup in the tub or shower regularly. Attach bath mats securely with double-sided non-slip rug tape. Do not have throw rugs and other things on the floor that can make you trip. What can I do in the bedroom? Use night lights. Make sure that you have a light by your bed that is easy to reach. Do not use any sheets or blankets that are too big for your bed. They should not hang down onto the floor. Have a firm chair that has side arms. You can use this for support while you get dressed. Do not have throw rugs and other things on the floor that can make you trip. What can I do in the kitchen? Clean up any spills right away. Avoid walking on wet floors. Keep items that you use a lot in easy-to-reach places. If  you need to reach something above you, use a strong step stool that has a grab bar. Keep electrical cords out of the way. Do not use floor polish or wax that makes floors slippery. If you must use wax, use non-skid floor wax. Do not have throw rugs and other things on the floor that can make you trip. What can I do with my stairs? Do not leave any items on the stairs. Make sure that there are handrails on both sides of the stairs and use them. Fix handrails that are broken or loose. Make sure that handrails are as long as the stairways. Check any carpeting to make sure that it is firmly attached to the stairs. Fix any carpet that is loose or worn. Avoid having throw rugs at the top or bottom of the stairs. If you do have throw rugs, attach them to the floor with carpet tape. Make sure that you have a light switch at the top of the stairs and the bottom of the stairs. If  you do not have them, ask someone to add them for you. What else can I do to help prevent falls? Wear shoes that: Do not have high heels. Have rubber bottoms. Are comfortable and fit you well. Are closed at the toe. Do not wear sandals. If you use a stepladder: Make sure that it is fully opened. Do not climb a closed stepladder. Make sure that both sides of the stepladder are locked into place. Ask someone to hold it for you, if possible. Clearly mark and make sure that you can see: Any grab bars or handrails. First and last steps. Where the edge of each step is. Use tools that help you move around (mobility aids) if they are needed. These include: Canes. Walkers. Scooters. Crutches. Turn on the lights when you go into a dark area. Replace any light bulbs as soon as they burn out. Set up your furniture so you have a clear path. Avoid moving your furniture around. If any of your floors are uneven, fix them. If there are any pets around you, be aware of where they are. Review your medicines with your doctor. Some medicines can make you feel dizzy. This can increase your chance of falling. Ask your doctor what other things that you can do to help prevent falls. This information is not intended to replace advice given to you by your health care provider. Make sure you discuss any questions you have with your health care provider. Document Released: 01/17/2009 Document Revised: 08/29/2015 Document Reviewed: 04/27/2014 Elsevier Interactive Patient Education  2017 [employer].

## 2022-06-10 NOTE — Progress Notes (Signed)
Subjective:   Timothy Jimenez is a 72 y.o. male who presents for Medicare Annual/Subsequent preventive examination.  Review of Systems    ***       Objective:    There were no vitals filed for this visit. There is no height or weight on file to calculate BMI.     06/05/2021    8:30 AM 07/02/2018    2:26 PM  Advanced Directives  Does Patient Have a Medical Advance Directive? No No  Would patient like information on creating a medical advance directive? No - Patient declined     Current Medications (verified) Outpatient Encounter Medications as of 06/11/2022  Medication Sig   levothyroxine (SYNTHROID) 150 MCG tablet Take 1 tablet (150 mcg total) by mouth daily before breakfast.   pantoprazole (PROTONIX) 40 MG tablet Take 40 mg by mouth daily.   SSD 1 % cream APPLY CREAM TOPICALLY ONCE DAILY (Patient taking differently: Apply 1 Application topically as needed.)   No facility-administered encounter medications on file as of 06/11/2022.    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 Watt Climes)    ESOPHAGOGASTRODUODENOSCOPY     medium HH esophagitis ( Dr  Watt Climes) 02/25/2009   TONSILLECTOMY     43 yoa   Family History  Problem Relation Age of Onset   Thyroid disease Mother    Heart failure Mother    Hypertension Mother    Stroke Mother    Kidney disease Father    Hypertension Father     Kidney failure Father    Heart attack Father    COPD Sister    Rheumatic fever Brother        age 66 h/o w heart complications   Hypertension Brother    Stroke Brother    Heart disease Brother    Liver disease Paternal Aunt    Heart disease Other        many cousins deceased MI x2   Hypertension Other        mothers side   Diabetes Other        mothers side   Lung cancer Other        paternal/maternal grandparents   Prostate cancer Neg Hx    Colon cancer Neg Hx    Social History   Socioeconomic History   Marital status: Married    Spouse name: [name]   Number of children: 2   Years of education: Not on file   Highest education level: Not on file  Occupational History   Not on file  Tobacco Use   Smoking status: Former    Packs/day: 0.50    Years: 3.00    Total pack years: 1.50    Types: Cigarettes    Quit date: 1977    Years since quitting: 47.2   Smokeless  tobacco: Never   Tobacco comments:    stopped amoking 35 years ago  Vaping Use   Vaping Use: Never used  Substance and Sexual Activity   Alcohol use: No   Drug use: No   Sexual activity: Yes  Other Topics Concern   Not on file  Social History Narrative   Marital Status: Married [place]: [place]   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   Social Determinants of Health   Financial Resource Strain: Low Risk  (06/05/2021)   Overall Financial Resource Strain (CARDIA)    Difficulty of Paying Living Expenses: Not hard at all  Food Insecurity: No Food Insecurity (06/05/2021)   Hunger Vital Sign    Worried About Running Out of Food in the Last Year: Never true    Ran Out of Food in the Last Year: Never true  Transportation Needs: No Transportation Needs (06/05/2021)   PRAPARE - Hydrologist (Medical): No    Lack of Transportation (Non-Medical): No  Physical Activity: Insufficiently Active  (06/05/2021)   Exercise Vital Sign    Days of Exercise per Week: 5 days    Minutes of Exercise per Session: 20 min  Stress: No Stress Concern Present (06/05/2021)   [place]    Feeling of Stress : Not at all  Social Connections: [place] (06/05/2021)   Social Connection and Isolation Panel [NHANES]    Frequency of Communication with Friends and Family: More than three times a week    Frequency of Social Gatherings with Friends and Family: More than three times a week    Attends Religious Services: More than 4 times per year    Active Member of [employer] or Organizations: Yes    Attends Music therapist: More than 4 times per year    Marital Status: Married    Tobacco Counseling Counseling given: Not Answered Tobacco comments: stopped amoking 35 years ago   Clinical Intake:                 Diabetic?No          Activities of Daily Living     No data to display          Patient Care Team: Susy Frizzle, MD as PCP - General (Family Medicine)  Indicate any recent Medical Services you may have received from other than [facility] providers in the past year (date may be approximate).     Assessment:   This is a routine wellness examination for [place].  Hearing/Vision screen No results found.  Dietary issues and exercise activities discussed:     Goals Addressed   None    Depression Screen    06/05/2021    8:21 AM 08/26/2020    2:34 PM 11/30/2017   10:49 AM 11/30/2017   10:07 AM 11/02/2017    3:31 PM 09/01/2016   10:51 AM  PHQ 2/9 Scores  PHQ - 2 Score 0 0 0 0 0 0    Fall Risk    06/05/2021    8:32 AM 08/26/2020    2:34 PM 11/30/2017   10:49 AM 11/30/2017   10:07 AM 11/02/2017    3:31 PM  Fall Risk   Falls in the past year? 1 0 No No No  Number falls in past yr: 0      Injury  with Fall? 0      Risk for fall due to : History of fall(s) No Fall Risks     Follow up Falls  prevention discussed Falls evaluation completed       [place]:  Any stairs in or around the home? {YES/NO:21197} If so, are there any without handrails? {YES/NO:21197} Home free of loose throw rugs in walkways, pet beds, electrical cords, etc? {YES/NO:21197} Adequate lighting in your home to reduce risk of falls? {YES/NO:21197}  ASSISTIVE DEVICES UTILIZED TO PREVENT FALLS:  Life alert? {YES/NO:21197} Use of a cane, walker or w/c? {YES/NO:21197} Grab bars in the bathroom? {YES/NO:21197} Shower chair or bench in shower? {YES/NO:21197} Elevated toilet seat or a handicapped toilet? {YES/NO:21197}  TIMED UP AND GO:  Was the test performed? {YES/NO:21197}.  Length of time to ambulate 10 feet: *** sec.   {Appearance of GA:[id number]  Cognitive Function:        06/05/2021    8:35 AM  6CIT Screen  What Year? 0 points  What month? 0 points  What time? 0 points  Count back from 20 0 points  Months in reverse 0 points  Repeat phrase 0 points  Total Score 0 points    Immunizations Immunization History  Administered Date(s) Administered   Fluad Quad(high Dose 65+) 02/16/2019, 02/12/2022   Influenza, High Dose Seasonal PF 01/07/2018   Influenza-Unspecified 02/18/2021   PFIZER(Purple Top)SARS-COV-2 Vaccination 11/17/2019, 12/08/2019   Pfizer Covid-19 Vaccine Bivalent Booster 105yr & up 05/10/2020   Pneumococcal Conjugate-13 09/01/2016   Pneumococcal Polysaccharide-23 09/03/2015   Td 04/06/2001   Tdap 10/23/2011, 07/02/2018   Zoster, Live 04/06/2013    TDAP status: Up to date  Flu Vaccine status: Up to date  Pneumococcal vaccine status: Up to date  Covid-19 vaccine status: Information provided on how to obtain vaccines.   Qualifies for Shingles Vaccine? Yes   Zostavax completed Yes   Shingrix Completed?: No.    Education has been provided regarding the importance of this vaccine. Patient has been advised to call insurance company to  determine out of pocket expense if they have not yet received this vaccine. Advised may also receive vaccine at local pharmacy or Health Dept. Verbalized acceptance and understanding.  Screening Tests Health Maintenance  Topic Date Due   Zoster Vaccines- Shingrix (1 of 2) Never done   COVID-19 Vaccine (4 - 2023-24 season) 12/05/2021   Medicare Annual Wellness (AWV)  06/06/2022   DTaP/Tdap/Td (4 - Td or Tdap) 07/01/2028   COLONOSCOPY (Pts 45-425yrInsurance coverage will need to be confirmed)  07/06/2030   Pneumonia Vaccine 6593Years old  Completed   INFLUENZA VACCINE  Completed   Hepatitis C Screening  Completed   HPV VACCINES  Aged Out    Health Maintenance  Health Maintenance Due  Topic Date Due   Zoster Vaccines- Shingrix (1 of 2) Never done   COVID-19 Vaccine (4 - 2023-24 season) 12/05/2021   Medicare Annual Wellness (AWV)  06/06/2022    Colorectal cancer screening: Type of screening: Colonoscopy. Completed 07/05/20. Repeat every 10 years  Lung Cancer Screening: (Low Dose CT Chest recommended if Age 72-80ears, 30 pack-year currently smoking OR have quit w/in 15years.) does not qualify.   Lung Cancer Screening Referral: n/a  Additional Screening:  Hepatitis C Screening: does qualify; Completed 11/29/14  Vision Screening: Recommended annual ophthalmology exams for early detection of glaucoma and other disorders of the eye. Is the patient up to date with their annual eye exam?  {  YES/NO:21197} Who is the provider or what is the name of the office in which the patient attends annual eye exams? *** If pt is not established with a provider, would they like to be referred to a provider to establish care? {YES/NO:21197}.   Dental Screening: Recommended annual dental exams for proper oral hygiene  Community Resource Referral / Chronic Care Management: CRR required this visit?  {YES/NO:21197}  CCM required this visit?  {YES/NO:21197}     Plan:     I have personally  reviewed and noted the following in the patient's chart:   Medical and social history Use of alcohol, tobacco or illicit drugs  Current medications and supplements including opioid prescriptions. {Opioid Prescriptions:[phone]} Functional ability and status Nutritional status Physical activity Advanced directives List of other physicians Hospitalizations, surgeries, and ER visits in previous 12 months Vitals Screenings to include cognitive, depression, and falls Referrals and appointments  In addition, I have reviewed and discussed with patient certain preventive protocols, quality metrics, and best practice recommendations. A written personalized care plan for preventive services as well as general preventive health recommendations were provided to patient.     Denman George [place], [place]   [id number]   Nurse Notes: ***

## 2022-06-11 ENCOUNTER — Ambulatory Visit: Payer: Medicare Other

## 2022-06-11 VITALS — BP 128/62 | Ht 75.0 in | Wt 261.0 lb

## 2022-06-11 DIAGNOSIS — Z0000 Encounter for general adult medical examination without abnormal findings: Secondary | ICD-10-CM

## 2022-06-19 ENCOUNTER — Other Ambulatory Visit: Payer: Medicare Other

## 2022-06-19 DIAGNOSIS — E039 Hypothyroidism, unspecified: Secondary | ICD-10-CM

## 2022-06-19 DIAGNOSIS — Z125 Encounter for screening for malignant neoplasm of prostate: Secondary | ICD-10-CM

## 2022-06-19 DIAGNOSIS — Z13220 Encounter for screening for lipoid disorders: Secondary | ICD-10-CM

## 2022-06-23 ENCOUNTER — Ambulatory Visit (INDEPENDENT_AMBULATORY_CARE_PROVIDER_SITE_OTHER): Payer: Medicare Other | Admitting: Family Medicine

## 2022-06-23 ENCOUNTER — Encounter: Payer: Self-pay | Admitting: Family Medicine

## 2022-06-23 VITALS — BP 126/78 | HR 67 | Temp 97.8°F | Ht 75.0 in | Wt 260.0 lb

## 2022-06-23 DIAGNOSIS — Z125 Encounter for screening for malignant neoplasm of prostate: Secondary | ICD-10-CM | POA: Diagnosis not present

## 2022-06-23 DIAGNOSIS — K5730 Diverticulosis of large intestine without perforation or abscess without bleeding: Secondary | ICD-10-CM | POA: Insufficient documentation

## 2022-06-23 DIAGNOSIS — E039 Hypothyroidism, unspecified: Secondary | ICD-10-CM | POA: Diagnosis not present

## 2022-06-23 DIAGNOSIS — R1310 Dysphagia, unspecified: Secondary | ICD-10-CM | POA: Insufficient documentation

## 2022-06-23 DIAGNOSIS — Z0000 Encounter for general adult medical examination without abnormal findings: Secondary | ICD-10-CM

## 2022-06-23 NOTE — Progress Notes (Signed)
Subjective:    Patient ID: Timothy Jimenez, male    DOB: 14-Feb-1951, 72 y.o.   MRN: [id number]  HPI Patient is a very pleasant 72 year old Caucasian gentleman here today for complete physical exam.  He denies any concerns.  He has had Pneumovax 23 and Prevnar 13.  He has had the first dose of his shingles shot.  He is getting the second 1 in 2 months.  He has not had a recent COVID shot.  He has not had an RSV shot.  The remainder of his vaccinations are up-to-date.  He had a colonoscopy in 2022.  His PSA is still pending at the time of this dictation.  He denies any falls or depression or memory loss. Lab on 06/19/2022  Component Date Value Ref Range Status   WBC 06/19/2022 5.8  3.8 - 10.8 Thousand/uL Final   RBC 06/19/2022 4.44  4.20 - 5.80 Million/uL Final   Hemoglobin 06/19/2022 13.5  13.2 - 17.1 g/dL Final   HCT 06/19/2022 40.6  38.5 - 50.0 % Final   MCV 06/19/2022 91.4  80.0 - 100.0 fL Final   MCH 06/19/2022 30.4  27.0 - 33.0 pg Final   MCHC 06/19/2022 33.3  32.0 - 36.0 g/dL Final   RDW 06/19/2022 13.0  11.0 - 15.0 % Final   Platelets 06/19/2022 196  140 - 400 Thousand/uL Final   MPV 06/19/2022 10.4  7.5 - 12.5 fL Final   Neutro Abs 06/19/2022 3,741  1,500 - 7,800 cells/uL Final   Lymphs Abs 06/19/2022 1,108  850 - 3,900 cells/uL Final   Absolute Monocytes 06/19/2022 737  200 - 950 cells/uL Final   Eosinophils Absolute 06/19/2022 174  15 - 500 cells/uL Final   Basophils Absolute 06/19/2022 41  0 - 200 cells/uL Final   Neutrophils Relative % 06/19/2022 64.5  % Final   Total Lymphocyte 06/19/2022 19.1  % Final   Monocytes Relative 06/19/2022 12.7  % Final   Eosinophils Relative 06/19/2022 3.0  % Final   Basophils Relative 06/19/2022 0.7  % Final   Glucose, Bld 06/19/2022 102 (H)  65 - 99 mg/dL Final   Comment: .            Fasting reference interval . For someone without known diabetes, a glucose value between 100 and 125 mg/dL is consistent with prediabetes and should be  confirmed with a follow-up test. .    BUN 06/19/2022 17  7 - 25 mg/dL Final   Creat 06/19/2022 1.26  0.70 - 1.28 mg/dL Final   eGFR 06/19/2022 61  > OR = 60 mL/min/1.63m2 Final   BUN/Creatinine Ratio 06/19/2022 SEE NOTE:  6 - 22 (calc) Final   Comment:    Not Reported: BUN and Creatinine are within    reference range. .    Sodium 06/19/2022 141  135 - 146 mmol/L Final   Potassium 06/19/2022 4.0  3.5 - 5.3 mmol/L Final   Chloride 06/19/2022 106  98 - 110 mmol/L Final   CO2 06/19/2022 25  20 - 32 mmol/L Final   Calcium 06/19/2022 8.8  8.6 - 10.3 mg/dL Final   Total Protein 06/19/2022 6.7  6.1 - 8.1 g/dL Final   Albumin 06/19/2022 4.0  3.6 - 5.1 g/dL Final   Globulin 06/19/2022 2.7  1.9 - 3.7 g/dL (calc) Final   AG Ratio 06/19/2022 1.5  1.0 - 2.5 (calc) Final   Total Bilirubin 06/19/2022 0.5  0.2 - 1.2 mg/dL Final   Alkaline phosphatase (APISO) 06/19/2022  91  35 - 144 U/L Final   AST 06/19/2022 15  10 - 35 U/L Final   ALT 06/19/2022 13  9 - 46 U/L Final   Cholesterol 06/19/2022 151  <200 mg/dL Final   HDL 06/19/2022 43  > OR = 40 mg/dL Final   Triglycerides 06/19/2022 85  <150 mg/dL Final   LDL Cholesterol (Calc) 06/19/2022 90  mg/dL (calc) Final   Comment: Reference range: <100 . Desirable range <100 mg/dL for primary prevention;   <70 mg/dL for patients with CHD or diabetic patients  with > or = 2 CHD risk factors. Marland Kitchen LDL-C is now calculated using the Martin-Hopkins  calculation, which is a validated novel method providing  better accuracy than the Friedewald equation in the  estimation of LDL-C.  Cresenciano Genre et al. Annamaria Helling. [id number]): 2061-2068  (http://education.[facility].com/faq/FAQ164)    Total CHOL/HDL Ratio 06/19/2022 3.5  <5.0 (calc) Final   Non-HDL Cholesterol (Calc) 06/19/2022 108  <130 mg/dL (calc) Final   Comment: For patients with diabetes plus 1 major ASCVD risk  factor, treating to a non-HDL-C goal of <100 mg/dL  (LDL-C of <70 mg/dL) is considered a  therapeutic  option.    TSH 06/19/2022 6.29 (H)  0.40 - 4.50 mIU/L Final   Free T4 06/19/2022 1.2  0.8 - 1.8 ng/dL Final   Labs are significant for an elevated fasting blood sugar of 107.  Patient admits that he eats a diet high in carbohydrates.  He is not getting regular size.  Past medical history is also significant for subacute bacterial endocarditis.  When I examined the patient today, he has a gallop that I have not appreciated before.  It is difficult to determine for me if it is a split S2 or if it could be an S3.  I have not appreciated this before.  He denies any chest pain shortness of breath or dyspnea on exertion.  He denies any orthopnea or paroxysmal nocturnal dyspnea. His last colonoscopy was in 2010.  He is due again next year.  He is due for prostate exam today.  Immunizations are up-to-date except for his flu shot.  Please see below: Immunization History  Administered Date(s) Administered   Fluad Quad(high Dose 65+) 02/16/2019, 02/12/2022   Influenza, High Dose Seasonal PF 01/07/2018   Influenza-Unspecified 02/18/2021   PFIZER(Purple Top)SARS-COV-2 Vaccination 11/17/2019, 12/08/2019   Pfizer Covid-19 Vaccine Bivalent Booster 10yrs & up 05/10/2020   Pneumococcal Conjugate-13 09/01/2016   Pneumococcal Polysaccharide-23 09/03/2015   Td 04/06/2001   Tdap 10/23/2011, 07/02/2018   Zoster Recombinat (Shingrix) 06/17/2022   Zoster, Live 04/06/2013    Past Medical History:  Diagnosis Date   Allergic rhinitis 1999   Asthma    GERD (gastroesophageal reflux disease) 2002   History of pneumonia 1997   Hypothyroidism 2002   Other abnormal clinical finding    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 Watt Climes)    ESOPHAGOGASTRODUODENOSCOPY     medium HH esophagitis ( Dr  Watt Climes) 02/25/2009   TONSILLECTOMY     20 yoa   Current Outpatient Medications on File Prior  to Visit  Medication Sig Dispense Refill   levothyroxine (SYNTHROID) 150 MCG tablet Take 1 tablet (150 mcg total) by mouth daily before breakfast. 90 tablet 1   pantoprazole (PROTONIX) 40 MG tablet Take 40 mg by mouth daily.     No current facility-administered medications on file prior to visit.   Allergies  Allergen Reactions   Polycillin [Ampicillin]     Hives    Social History   Socioeconomic History   Marital status: Married    Spouse name: [name]   Number of children: 2   Years of education: Not on file   Highest education level: Not on file  Occupational History   Not on file  Tobacco Use   Smoking status: Former    Packs/day: 0.50    Years: 3.00    Additional pack years: 0.00    Total pack years: 1.50    Types: Cigarettes    Quit date: 19    Years since quitting: 47.2   Smokeless tobacco: Never   Tobacco comments:    stopped amoking 35 years ago  Vaping Use   Vaping Use: Never used  Substance and Sexual Activity   Alcohol use: No   Drug use: No   Sexual activity: Yes  Other Topics Concern   Not on file  Social History Narrative   Marital Status: Married [place]: 2 [place]   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   Social Determinants of Health   Financial Resource Strain: Low Risk  (06/11/2022)   Overall Financial Resource Strain (CARDIA)    Difficulty of Paying Living Expenses: Not hard at all  Food Insecurity: No Food Insecurity (06/11/2022)   Hunger Vital Sign    Worried About Running Out of Food in the Last Year: Never true    [place] in the Last Year: Never true  Transportation Needs: No Transportation Needs (06/11/2022)   PRAPARE -  Hydrologist (Medical): No    Lack of Transportation (Non-Medical): No  Physical Activity: Sufficiently Active (06/11/2022)   Exercise Vital Sign    Days of Exercise per Week: 5 days    Minutes of Exercise per Session: 30 min  Stress: No Stress Concern Present (06/11/2022)   [place]    Feeling of Stress : Not at all  Social Connections: [place] (06/11/2022)   Social Connection and Isolation Panel [NHANES]    Frequency of Communication with Friends and Family: More than three times a week    Frequency of Social Gatherings with Friends and Family: More than three times a week    Attends Religious Services: More than 4 times per year    Active Member of [employer] or Organizations: Yes    Attends Music therapist: More than 4 times per year    Marital Status: Married  Human resources officer Violence: Not At Risk (06/11/2022)   Humiliation, Afraid, Rape, and Kick questionnaire    Fear of Current or Ex-Partner: No    Emotionally Abused: No    Physically Abused: No    Sexually Abused: No   Family History  Problem Relation Age of Onset   Thyroid disease Mother    Heart failure Mother    Hypertension Mother    Stroke Mother    Kidney disease Father    Hypertension Father  Kidney failure Father    Heart attack Father    COPD Sister    Rheumatic fever Brother        age 27 h/o w heart complications   Hypertension Brother    Stroke Brother    Heart disease Brother    Liver disease Paternal Aunt    Heart disease Other        many cousins deceased MI x2   Hypertension Other        mothers side   Diabetes Other        mothers side   Lung cancer Other        paternal/maternal grandparents   Prostate cancer Neg Hx    Colon cancer Neg Hx       Review of Systems  All other systems reviewed and are negative.      Objective:   Physical Exam Vitals reviewed.   Constitutional:      General: He is not in acute distress.    Appearance: Normal appearance. He is well-developed. He is obese. He is not ill-appearing or diaphoretic.  HENT:     Head: Normocephalic and atraumatic.     Right Ear: Tympanic membrane, ear canal and external ear normal.     Left Ear: Tympanic membrane, ear canal and external ear normal.     Nose: Nose normal. No congestion or rhinorrhea.     Mouth/Throat:     Mouth: Mucous membranes are moist.     Pharynx: No oropharyngeal exudate or posterior oropharyngeal erythema.  Eyes:     General: No scleral icterus.       Right eye: No discharge.        Left eye: No discharge.     Extraocular Movements: Extraocular movements intact.     Conjunctiva/sclera: Conjunctivae normal.     Pupils: Pupils are equal, round, and reactive to light.  Neck:     Thyroid: No thyromegaly.     Vascular: No carotid bruit or JVD.     Trachea: No tracheal deviation.  Cardiovascular:     Rate and Rhythm: Normal rate and regular rhythm.     Pulses: Normal pulses.     Heart sounds: No murmur heard.    No friction rub. No gallop.  Pulmonary:     Effort: Pulmonary effort is normal. No respiratory distress.     Breath sounds: Normal breath sounds. No stridor. No wheezing or rales.  Chest:     Chest wall: No tenderness.  Abdominal:     General: Bowel sounds are normal. There is no distension.     Palpations: Abdomen is soft. There is no mass.     Tenderness: There is no abdominal tenderness. There is no guarding or rebound.  Genitourinary:    Penis: Normal.      Prostate: Normal.     Rectum: External hemorrhoid present.    Musculoskeletal:        General: No tenderness. Normal range of motion.     Cervical back: Normal range of motion and neck supple. No rigidity.     Right lower leg: No edema.     Left lower leg: No edema.  Lymphadenopathy:     Cervical: No cervical adenopathy.  Skin:    General: Skin is warm.     Coloration: Skin is not  jaundiced or pale.     Findings: No bruising, erythema, lesion or rash.  Neurological:     General: No focal deficit present.  Mental Status: He is alert and oriented to person, place, and time. Mental status is at baseline.     Cranial Nerves: No cranial nerve deficit.     Sensory: No sensory deficit.     Motor: No weakness or abnormal muscle tone.     Coordination: Coordination normal.     Gait: Gait normal.     Deep Tendon Reflexes: Reflexes are normal and symmetric. Reflexes normal.  Psychiatric:        Behavior: Behavior normal.        Thought Content: Thought content normal.        Judgment: Judgment normal.           Assessment & Plan:  Encounter for Medicare annual wellness exam  Hypothyroidism, unspecified type  Prostate cancer screening Patient's physical exam is normal today except for mildly elevated blood sugar and his weight.  I recommended diet exercise and weight loss.  I recommended reducing the carbohydrates in his diet.  He does report some dry gritty sensation in his eye.  He has a history of Graves' disease although this has been treated.  I recommended following up with his ophthalmologist for a thorough eye exam.  He has an appointment to see his endocrinologist upcoming.  His TSH is slightly elevated despite taking 150 mcg of levothyroxine.  I recommended increasing to 175 and rechecking a TSH in 6 to 8 weeks.  He would like to discuss this with his endocrinologist first which I certainly respect.  I will be glad to help any way I can.  He denies any falls, memory loss, or depression

## 2022-06-24 LAB — TEST AUTHORIZATION

## 2022-06-24 LAB — COMPLETE METABOLIC PANEL WITH GFR
AG Ratio: 1.5 (calc) (ref 1.0–2.5)
ALT: 13 U/L (ref 9–46)
AST: 15 U/L (ref 10–35)
Albumin: 4 g/dL (ref 3.6–5.1)
Alkaline phosphatase (APISO): 91 U/L (ref 35–144)
BUN: 17 mg/dL (ref 7–25)
CO2: 25 mmol/L (ref 20–32)
Calcium: 8.8 mg/dL (ref 8.6–10.3)
Chloride: 106 mmol/L (ref 98–110)
Creat: 1.26 mg/dL (ref 0.70–1.28)
Globulin: 2.7 g/dL (calc) (ref 1.9–3.7)
Glucose, Bld: 102 mg/dL — ABNORMAL HIGH (ref 65–99)
Potassium: 4 mmol/L (ref 3.5–5.3)
Sodium: 141 mmol/L (ref 135–146)
Total Bilirubin: 0.5 mg/dL (ref 0.2–1.2)
Total Protein: 6.7 g/dL (ref 6.1–8.1)
eGFR: 61 mL/min/{1.73_m2} (ref >=60)

## 2022-06-24 LAB — LIPID PANEL
Cholesterol: 151 mg/dL (ref <200)
HDL: 43 mg/dL (ref >=40)
LDL Cholesterol (Calc): 90 mg/dL (calc)
Non-HDL Cholesterol (Calc): 108 mg/dL (calc) (ref <130)
Total CHOL/HDL Ratio: 3.5 (calc) (ref <5.0)
Triglycerides: 85 mg/dL (ref <150)

## 2022-06-24 LAB — CBC WITH DIFFERENTIAL/PLATELET
Absolute Monocytes: 737 cells/uL (ref 200–950)
Basophils Absolute: 41 cells/uL (ref 0–200)
Basophils Relative: 0.7 %
Eosinophils Absolute: 174 cells/uL (ref 15–500)
Eosinophils Relative: 3 %
HCT: 40.6 % (ref 38.5–50.0)
Hemoglobin: 13.5 g/dL (ref 13.2–17.1)
Lymphs Abs: 1108 cells/uL (ref 850–3900)
MCH: 30.4 pg (ref 27.0–33.0)
MCHC: 33.3 g/dL (ref 32.0–36.0)
MCV: 91.4 fL (ref 80.0–100.0)
MPV: 10.4 fL (ref 7.5–12.5)
Monocytes Relative: 12.7 %
Neutro Abs: 3741 cells/uL (ref 1500–7800)
Neutrophils Relative %: 64.5 %
Platelets: 196 10*3/uL (ref 140–400)
RBC: 4.44 10*6/uL (ref 4.20–5.80)
RDW: 13 % (ref 11.0–15.0)
Total Lymphocyte: 19.1 %
WBC: 5.8 10*3/uL (ref 3.8–10.8)

## 2022-06-24 LAB — PSA: PSA: 1.03 ng/mL (ref <=4.00)

## 2022-06-24 LAB — TSH
TSH: 6.29 mIU/L — ABNORMAL HIGH (ref 0.40–4.50)
TSH: 6.29 — AB (ref 0.41–5.90)

## 2022-06-24 LAB — T4, FREE: Free T4: 1.2 ng/dL (ref 0.8–1.8)

## 2022-06-25 NOTE — Addendum Note (Signed)
Addended by: Jenna Luo T on: 06/25/2022 04:52 PM   Modules accepted: Level of Service

## 2022-06-29 ENCOUNTER — Other Ambulatory Visit: Payer: Self-pay | Admitting: *Deleted

## 2022-06-29 ENCOUNTER — Telehealth: Payer: Self-pay | Admitting: *Deleted

## 2022-06-29 LAB — CBC AND DIFFERENTIAL: WBC: 5.8

## 2022-06-29 LAB — CBC: RBC: 4.44 (ref 3.87–5.11)

## 2022-06-29 LAB — BASIC METABOLIC PANEL
BUN: 17 (ref 4–21)
Creatinine: 1.3 (ref 0.6–1.3)
Glucose: 102

## 2022-06-29 LAB — HEPATIC FUNCTION PANEL
ALT: 13 U/L (ref 10–40)
Alkaline Phosphatase: 91 (ref 25–125)

## 2022-06-29 LAB — COMPREHENSIVE METABOLIC PANEL: Albumin: 4 (ref 3.5–5.0)

## 2022-06-29 NOTE — Telephone Encounter (Signed)
Patient has had Labs that are nned for his April 2024 visit with  Loree Fee- His PCP drew them.

## 2022-07-06 NOTE — Patient Instructions (Signed)

## 2022-07-07 ENCOUNTER — Encounter: Payer: Self-pay | Admitting: Nurse Practitioner

## 2022-07-07 ENCOUNTER — Ambulatory Visit (INDEPENDENT_AMBULATORY_CARE_PROVIDER_SITE_OTHER): Payer: Medicare Other | Admitting: Nurse Practitioner

## 2022-07-07 VITALS — BP 138/88 | HR 67 | Ht 75.0 in | Wt 259.8 lb

## 2022-07-07 DIAGNOSIS — E890 Postprocedural hypothyroidism: Secondary | ICD-10-CM | POA: Diagnosis not present

## 2022-07-07 MED ORDER — LEVOTHYROXINE SODIUM 175 MCG PO TABS: 175.0000 ug | ORAL_TABLET | Freq: Every day | ORAL | 1 refills | Status: DC

## 2022-07-07 NOTE — Progress Notes (Signed)
07/07/2022     Endocrinology Follow Up Note    Subjective:    Patient ID: Timothy Jimenez, male    DOB: September 15, 1950, PCP Susy Frizzle, MD.   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 Watt Climes)    ESOPHAGOGASTRODUODENOSCOPY     medium HH esophagitis ( Dr  Watt Climes) 02/25/2009   TONSILLECTOMY     61 yoa    Social History   Socioeconomic History   Marital status: Married    Spouse name: [name]   Number of children: 2   Years of education: Not on file   Highest education level: Not on file  Occupational History   Not on file  Tobacco Use   Smoking status: Former    Packs/day: 0.50    Years: 3.00    Additional pack years: 0.00    Total pack years: 1.50    Types: Cigarettes    Quit date: 1977    Years since quitting: 47.2   Smokeless tobacco: Never   Tobacco comments:    stopped amoking 35 years ago  Vaping Use   Vaping Use: Never used  Substance and Sexual Activity   Alcohol use: No   Drug use: No   Sexual activity: Yes  Other Topics Concern   Not on file  Social History Narrative   Marital Status: Married [place]: 2 [place]   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   Social Determinants of Health   Financial Resource Strain: [place]  (06/11/2022)   Overall Financial Resource Strain (CARDIA)    Difficulty of Paying Living  Expenses: Not hard at all  Food Insecurity: No Food Insecurity (06/11/2022)   Hunger Vital Sign    Worried About Running Out of Food in the Last Year: Never true    [place] in the Last Year: Never true  Transportation Needs: No Transportation Needs (06/11/2022)   PRAPARE - Hydrologist (Medical): No    Lack of Transportation (Non-Medical): No  Physical Activity: Sufficiently Active (06/11/2022)   Exercise Vital Sign    Days of Exercise per Week: 5 days    Minutes of Exercise per Session: 30 min  Stress: No Stress Concern Present (06/11/2022)   [place]    Feeling of Stress : Not at all  Social Connections: [place] (06/11/2022)   Social Connection and Isolation Panel [NHANES]    Frequency of Communication with Friends and Family: More than three times a week    Frequency of Social Gatherings with Friends and Family: More than three times a week    Attends Religious Services: More than 4 times per year    Active Member  of Clubs or Organizations: Yes    Attends Music therapist: More than 4 times per year    Marital Status: Married    Family History  Problem Relation Age of Onset   Thyroid disease Mother    Heart failure Mother    Hypertension Mother    Stroke Mother    Kidney disease Father    Hypertension Father    Kidney failure Father    Heart attack Father    COPD Sister    Rheumatic fever Brother        age 23 h/o w heart complications   Hypertension Brother    Stroke Brother    Heart disease Brother    Liver disease Paternal Aunt    Heart disease Other        many cousins deceased MI x2   Hypertension Other        mothers side   Diabetes Other        mothers side   Lung cancer Other        paternal/maternal grandparents   Prostate cancer Neg Hx    Colon cancer Neg Hx     Outpatient Encounter Medications as of 07/07/2022  Medication Sig    pantoprazole (PROTONIX) 40 MG tablet Take 40 mg by mouth daily.   [DISCONTINUED] levothyroxine (SYNTHROID) 150 MCG tablet Take 1 tablet (150 mcg total) by mouth daily before breakfast.   levothyroxine (SYNTHROID) 175 MCG tablet Take 1 tablet (175 mcg total) by mouth daily before breakfast.   No facility-administered encounter medications on file as of 07/07/2022.    ALLERGIES: Allergies  Allergen Reactions   Polycillin [Ampicillin]     Hives     VACCINATION STATUS: Immunization History  Administered Date(s) Administered   Fluad Quad(high Dose 65+) 02/16/2019, 02/12/2022   Influenza, High Dose Seasonal PF 01/07/2018   Influenza-Unspecified 02/18/2021   PFIZER(Purple Top)SARS-COV-2 Vaccination 11/17/2019, 12/08/2019   Pfizer Covid-19 Vaccine Bivalent Booster 43yrs & up 05/10/2020   Pneumococcal Conjugate-13 09/01/2016   Pneumococcal Polysaccharide-23 09/03/2015   Td 04/06/2001   Tdap 10/23/2011, 07/02/2018   Zoster Recombinat (Shingrix) 06/17/2022   Zoster, Live 04/06/2013     HPI  Timothy Jimenez is 72 y.o. male who presents today with a medical history as above. he is being seen in follow up after being seen in consultation for hyperthyroidism requested by Susy Frizzle, MD.  He was diagnosed years ago with under-active thyroid and had been on thyroid hormone replacement up until about 12 weeks ago where he was taken off due to undetectable TSH. he has been dealing with symptoms of heat intolerance, fatigue, unintentional weight loss, anxiety, palpitation, diarrhea, and worsening hoarseness for ?the last 6 months but his wife noticed this change starting around 1 year ago. These symptoms are progressively worsening and troubling to him.   He had positive TPO antibodies at 89, indicating autoimmune thyroid dysfunction.  he denies dysphagia, choking, shortness of breath, but wife has noticed some recent voice change.    he does have family history of thyroid dysfunction in his  mother, but denies family hx of thyroid cancer. he denies personal history of goiter. he is not currently on any anti-thyroid medications nor on any thyroid hormone supplements. Denies use of Biotin containing supplements.  He had RAI for Graves disease on 07/23/21.  Review of systems  Constitutional: + stable body weight,  current Body mass index is 32.47 kg/m. , + fatigue, no subjective hyperthermia, no subjective hypothermia Eyes:  no blurry vision, no xerophthalmia ENT: no sore throat, no nodules palpated in throat, no dysphagia/odynophagia, no hoarseness Cardiovascular: no chest pain, no shortness of breath, no palpitations, no leg swelling Respiratory: no cough, no shortness of breath Gastrointestinal: no nausea/vomiting/diarrhea Musculoskeletal: + diffuse muscle/joint aches-stable- possibly improved somewhat Skin: no rashes, no hyperemia Neurological: no tremors, no numbness, no tingling, no dizziness Psychiatric: no depression, no anxiety   Objective:    BP 138/88 (BP Location: Right Arm, Patient Position: Sitting, Cuff Size: Large) Comment: Recheck Manuel Cuff  Pulse 67   Ht 6\' 3"  (1.905 m)   Wt 259 lb 12.8 oz (117.8 kg)   BMI 32.47 kg/m   Wt Readings from Last 3 Encounters:  07/07/22 259 lb 12.8 oz (117.8 kg)  06/23/22 260 lb (117.9 kg)  06/11/22 261 lb (118.4 kg)     BP Readings from Last 3 Encounters:  07/07/22 138/88  06/23/22 126/78  06/11/22 128/62                        Physical Exam- Limited  Constitutional:  Body mass index is 32.47 kg/m. , not in acute distress, normal state of mind Eyes:  EOMI, no exophthalmos Musculoskeletal: no gross deformities, strength intact in all four extremities, no gross restriction of joint movements Skin:  no rashes, no hyperemia Neurological: no tremor with outstretched hands   CMP     Component Value Date/Time   NA 141 06/19/2022 0805   K 4.0 06/19/2022 0805   CL 106 06/19/2022 0805   CO2 25 06/19/2022 0805    GLUCOSE 102 (H) 06/19/2022 0805   BUN 17 06/29/2022 0000   CREATININE 1.3 06/29/2022 0000   CREATININE 1.26 06/19/2022 0805   CALCIUM 8.8 06/19/2022 0805   PROT 6.7 06/19/2022 0805   ALBUMIN 4.0 06/29/2022 0000   AST 15 06/19/2022 0805   ALT 13 06/29/2022 0000   ALKPHOS 91 06/29/2022 0000   BILITOT 0.5 06/19/2022 0805   GFRNONAA 62 08/26/2020 1452   GFRAA 71 08/26/2020 1452     CBC    Component Value Date/Time   WBC 5.8 06/29/2022 0000   WBC 5.8 06/19/2022 0805   RBC 4.44 06/29/2022 0000   HGB 13.5 06/19/2022 0805   HCT 40.6 06/19/2022 0805   PLT 196 06/19/2022 0805   MCV 91.4 06/19/2022 0805   MCH 30.4 06/19/2022 0805   MCHC 33.3 06/19/2022 0805   RDW 13.0 06/19/2022 0805   LYMPHSABS 1,108 06/19/2022 0805   MONOABS 574 08/07/2016 0858   EOSABS 174 06/19/2022 0805   BASOSABS 41 06/19/2022 0805     Diabetic Labs (most recent): Lab Results  Component Value Date   HGBA1C 5.7 06/18/2011   MICROALBUR 1.6 08/05/2009    Lipid Panel     Component Value Date/Time   CHOL 151 06/19/2022 0805   TRIG 85 06/19/2022 0805   HDL 43 06/19/2022 0805   CHOLHDL 3.5 06/19/2022 0805   VLDL 11 08/07/2016 0858   LDLCALC 90 06/19/2022 0805     Lab Results  Component Value Date   TSH 6.29 (A) 06/24/2022   TSH 6.29 (H) 06/19/2022   TSH 14.100 (H) 04/30/2022   TSH 15.100 (H) 03/02/2022   TSH 23.800 (H) 12/29/2021   TSH 60.300 (H) 11/03/2021   TSH 1.290 08/26/2021   TSH <0.01 (L) 06/05/2021   TSH <0.01 (L) 04/21/2021   TSH 0.09 (L) 02/17/2021   FREET4 1.2 06/19/2022   FREET4 1.28 04/30/2022  FREET4 1.21 03/02/2022   FREET4 1.10 12/29/2021   FREET4 0.57 (L) 11/03/2021   FREET4 0.68 (L) 08/26/2021   FREET4 1.6 06/11/2021   FREET4 1.0 02/17/2021   FREET4 1.1 08/05/2009   FREET4 0.7 05/31/2006    Uptake and Scan 06/26/21  CLINICAL DATA:  Hypothyroidism, cessation of levothyroxine therapy 12 weeks ago   EXAM: THYROID SCAN AND UPTAKE - 4 AND 24 HOURS    TECHNIQUE: Following oral administration of I-123 capsule, anterior planar imaging was acquired at 24 hours. Thyroid uptake was calculated with a thyroid probe at 4-6 hours and 24 hours.   RADIOPHARMACEUTICALS:  Four under 24 uCi I-123 sodium iodide p.o.   COMPARISON:  No relevant prior studies available for comparison at this institution   FINDINGS: Planar imaging the thyroid demonstrates normal homogeneous symmetrical uptake within the thyroid. No focal lesions identified.   4 hour I-123 uptake = 20.3% (normal 5-20%)   24 hour I-123 uptake = 45.7% (normal 10-30%)   IMPRESSION: 1. Elevated iodine uptake values at 4 hours and 24 hours. 2. No focal parenchymal abnormalities identified on imaging.     Electronically Signed   By: Randa Ngo M.D.   On: 06/26/2021 17:26   Latest Reference Range & Units 12/29/21 09:51 03/02/22 11:44 04/30/22 11:24 06/19/22 08:05  TSH 0.40 - 4.50 mIU/L 23.800 (H) 15.100 (H) 14.100 (H) 6.29 (H)  T4,Free(Direct) 0.8 - 1.8 ng/dL 1.10 1.21 1.28 1.2  (H): Data is abnormally high  Assessment & Plan:   1. Hypothyroidism-s/p RAI for Graves Disease  he is being seen at a kind request of Susy Frizzle, MD.  He had RAI for Graves disease on 07/23/21.  -His previsit thyroid function tests are consistent with under-replacement.  He is advised to increase his Levothyroxine to 175 mcg po daily before breakfast.     - The correct intake of thyroid hormone (Levothyroxine, Synthroid), is on empty stomach first thing in the morning, with water, separated by at least 30 minutes from breakfast and other medications,  and separated by more than 4 hours from calcium, iron, multivitamins, acid reflux medications (PPIs).  - This medication is a life-long medication and will be needed to correct thyroid hormone imbalances for the rest of your life.  The dose may change from time to time, based on thyroid blood work.  - It is extremely important to be consistent  taking this medication, near the same time each morning.  -AVOID TAKING PRODUCTS CONTAINING BIOTIN (commonly found in Hair, Skin, Nails vitamins) AS IT INTERFERES WITH THE VALIDITY OF THYROID FUNCTION BLOOD TESTS.      -Patient is advised to maintain close follow up with Susy Frizzle, MD for primary care needs.     I spent  20  minutes in the care of the patient today including review of labs from [place], Lipids, Thyroid Function, Hematology (current and previous including abstractions from other facilities); face-to-face time discussing  his blood glucose readings/logs, discussing hypoglycemia and hyperglycemia episodes and symptoms, medications doses, his options of short and long term treatment based on the latest standards of care / guidelines;  discussion about incorporating lifestyle medicine;  and documenting the encounter. Risk reduction counseling performed per USPSTF guidelines to reduce obesity and cardiovascular risk factors.     Please refer to Patient Instructions for Blood Glucose Monitoring and Insulin/Medications Dosing Guide"  in media tab for additional information. Please  also refer to " Patient Self Inventory" in the Media  tab for reviewed  elements of pertinent patient history.  Timothy Jimenez participated in the discussions, expressed understanding, and voiced agreement with the above plans.  All questions were answered to his satisfaction. he is encouraged to contact clinic should he have any questions or concerns prior to his return visit.   Follow up plan: Return in about 3 months (around 10/06/2022) for Thyroid follow up, Previsit labs.   Thank you for involving me in the care of this pleasant patient, and I will continue to update you with his progress.  Rayetta Pigg, [facility] [address] [place], [place] [address] Phone: [phone] Fax: [phone]  07/07/2022, 10:26 AM

## 2022-10-13 LAB — TSH: TSH: 0.46 u[IU]/mL (ref 0.450–4.500)

## 2022-10-13 LAB — T4, FREE: Free T4: 1.61 ng/dL (ref 0.82–1.77)

## 2022-10-14 NOTE — Patient Instructions (Signed)

## 2022-10-16 ENCOUNTER — Ambulatory Visit (INDEPENDENT_AMBULATORY_CARE_PROVIDER_SITE_OTHER): Payer: Medicare Other | Admitting: Nurse Practitioner

## 2022-10-16 ENCOUNTER — Encounter: Payer: Self-pay | Admitting: Nurse Practitioner

## 2022-10-16 VITALS — BP 140/86 | HR 64 | Ht 75.0 in | Wt 255.0 lb

## 2022-10-16 DIAGNOSIS — E890 Postprocedural hypothyroidism: Secondary | ICD-10-CM

## 2022-10-16 MED ORDER — LEVOTHYROXINE SODIUM 175 MCG PO TABS: 175.0000 ug | ORAL_TABLET | Freq: Every day | ORAL | 1 refills | Status: DC

## 2022-10-16 NOTE — Progress Notes (Signed)
10/16/2022     Endocrinology Follow Up Note    Subjective:    Patient ID: Timothy Jimenez, male    DOB: 1950-07-06, PCP Donita Brooks, MD.   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 Ewing Schlein)    ESOPHAGOGASTRODUODENOSCOPY     medium HH esophagitis ( Dr  Ewing Schlein) 02/25/2009   TONSILLECTOMY     20 yoa    Social History   Socioeconomic History   Marital status: Married    Spouse name: [name]   Number of children: 2   Years of education: Not on file   Highest education level: Not on file  Occupational History   Not on file  Tobacco Use   Smoking status: Former    Current packs/day: 0.00    Average packs/day: 0.5 packs/day for 3.0 years (1.5 ttl pk-yrs)    Types: Cigarettes    Start date: 39    Quit date: 36    Years since quitting: 47.5   Smokeless tobacco: Never   Tobacco comments:    stopped amoking 35 years ago  Vaping Use   Vaping status: Never Used  Substance and Sexual Activity   Alcohol use: No   Drug use: No   Sexual activity: Yes  Other Topics Concern   Not on file  Social History Narrative   Marital Status: Married 1984 LIVES WITH WIFE   Children: 2 DAUGHTERS OUT OF HOME   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   Social Determinants of Health   Financial Resource Strain: Low Risk  (06/11/2022)   Overall Financial Resource Strain (CARDIA)    Difficulty  of Paying Living Expenses: Not hard at all  Food Insecurity: No Food Insecurity (06/11/2022)   Hunger Vital Sign    Worried About Running Out of Food in the Last Year: Never true    Ran Out of Food in the Last Year: Never true  Transportation Needs: No Transportation Needs (06/11/2022)   PRAPARE - Administrator, Civil Service (Medical): No    Lack of Transportation (Non-Medical): No  Physical Activity: Sufficiently Active (06/11/2022)   Exercise Vital Sign    Days of Exercise per Week: 5 days    Minutes of Exercise per Session: 30 min  Stress: No Stress Concern Present (06/11/2022)   [employer] of Occupational Health - Occupational Stress Questionnaire    Feeling of Stress : Not at all  Social Connections: Socially Integrated (06/11/2022)   Social Connection and Isolation Panel [NHANES]    Frequency of Communication with Friends and Family: More than three times a week    Frequency of Social Gatherings with Friends and Family: More than three times a week    Attends Religious Services: More than 4 times per year    Active  Member of Clubs or Organizations: Yes    Attends Engineer, structural: More than 4 times per year    Marital Status: Married    Family History  Problem Relation Age of Onset   Thyroid disease Mother    Heart failure Mother    Hypertension Mother    Stroke Mother    Kidney disease Father    Hypertension Father    Kidney failure Father    Heart attack Father    COPD Sister    Rheumatic fever Brother        age 71 h/o w heart complications   Hypertension Brother    Stroke Brother    Heart disease Brother    Liver disease Paternal Aunt    Heart disease Other        many cousins deceased MI x2   Hypertension Other        mothers side   Diabetes Other        mothers side   Lung cancer Other        paternal/maternal grandparents   Prostate cancer Neg Hx    Colon cancer Neg Hx     Outpatient Encounter Medications as of 10/16/2022   Medication Sig   pantoprazole (PROTONIX) 40 MG tablet Take 40 mg by mouth daily.   [DISCONTINUED] levothyroxine (SYNTHROID) 175 MCG tablet Take 1 tablet (175 mcg total) by mouth daily before breakfast.   levothyroxine (SYNTHROID) 175 MCG tablet Take 1 tablet (175 mcg total) by mouth daily before breakfast.   No facility-administered encounter medications on file as of 10/16/2022.    ALLERGIES: Allergies  Allergen Reactions   Polycillin [Ampicillin]     Hives     VACCINATION STATUS: Immunization History  Administered Date(s) Administered   Fluad Quad(high Dose 65+) 02/16/2019, 02/12/2022   Influenza, High Dose Seasonal PF 01/07/2018   Influenza-Unspecified 02/18/2021   PFIZER(Purple Top)SARS-COV-2 Vaccination 11/17/2019, 12/08/2019   Pfizer Covid-19 Vaccine Bivalent Booster 70yrs & up 05/10/2020   Pneumococcal Conjugate-13 09/01/2016   Pneumococcal Polysaccharide-23 09/03/2015   Td 04/06/2001   Tdap 10/23/2011, 07/02/2018   Zoster Recombinant(Shingrix) 06/17/2022   Zoster, Live 04/06/2013     HPI  Timothy Jimenez is 72 y.o. male who presents today with a medical history as above. he is being seen in follow up after being seen in consultation for hyperthyroidism requested by Donita Brooks, MD.  He was diagnosed years ago with under-active thyroid and had been on thyroid hormone replacement up until about 12 weeks ago where he was taken off due to undetectable TSH. he has been dealing with symptoms of heat intolerance, fatigue, unintentional weight loss, anxiety, palpitation, diarrhea, and worsening hoarseness for ?the last 6 months but his wife noticed this change starting around 1 year ago. These symptoms are progressively worsening and troubling to him.   He had positive TPO antibodies at 89, indicating autoimmune thyroid dysfunction.  he denies dysphagia, choking, shortness of breath, but wife has noticed some recent voice change.    he does have family history of thyroid  dysfunction in his mother, but denies family hx of thyroid cancer. he denies personal history of goiter. he is not currently on any anti-thyroid medications nor on any thyroid hormone supplements. Denies use of Biotin containing supplements.  He had RAI for Graves disease on 07/23/21.  Review of systems  Constitutional: + slowly decreasing body weight,  current Body mass index is 31.87 kg/m. , + fatigue- somewhat improved, no subjective hyperthermia, no  subjective hypothermia Eyes: no blurry vision, no xerophthalmia ENT: no sore throat, no nodules palpated in throat, no dysphagia/odynophagia, no hoarseness Cardiovascular: no chest pain, no shortness of breath, no palpitations, no leg swelling Respiratory: no cough, no shortness of breath Gastrointestinal: no nausea/vomiting/diarrhea Musculoskeletal: + diffuse muscle/joint aches-stable Skin: no rashes, no hyperemia Neurological: no tremors, no numbness, no tingling, no dizziness Psychiatric: no depression, no anxiety   Objective:    BP (!) 140/86 (BP Location: Left Arm, Patient Position: Sitting, Cuff Size: Normal)   Pulse 64   Ht 6\' 3"  (1.905 m)   Wt 255 lb (115.7 kg)   BMI 31.87 kg/m   Wt Readings from Last 3 Encounters:  10/16/22 255 lb (115.7 kg)  07/07/22 259 lb 12.8 oz (117.8 kg)  06/23/22 260 lb (117.9 kg)     BP Readings from Last 3 Encounters:  10/16/22 (!) 140/86  07/07/22 138/88  06/23/22 126/78                         Physical Exam- Limited  Constitutional:  Body mass index is 31.87 kg/m. , not in acute distress, normal state of mind Eyes:  EOMI, no exophthalmos Musculoskeletal: no gross deformities, strength intact in all four extremities, no gross restriction of joint movements Skin:  no rashes, no hyperemia Neurological: no tremor with outstretched hands   CMP     Component Value Date/Time   NA 141 06/19/2022 0805   K 4.0 06/19/2022 0805   CL 106 06/19/2022 0805   CO2 25 06/19/2022 0805   GLUCOSE  102 (H) 06/19/2022 0805   BUN 17 06/29/2022 0000   CREATININE 1.3 06/29/2022 0000   CREATININE 1.26 06/19/2022 0805   CALCIUM 8.8 06/19/2022 0805   PROT 6.7 06/19/2022 0805   ALBUMIN 4.0 06/29/2022 0000   AST 15 06/19/2022 0805   ALT 13 06/29/2022 0000   ALKPHOS 91 06/29/2022 0000   BILITOT 0.5 06/19/2022 0805   GFRNONAA 62 08/26/2020 1452   GFRAA 71 08/26/2020 1452     CBC    Component Value Date/Time   WBC 5.8 06/29/2022 0000   WBC 5.8 06/19/2022 0805   RBC 4.44 06/29/2022 0000   HGB 13.5 06/19/2022 0805   HCT 40.6 06/19/2022 0805   PLT 196 06/19/2022 0805   MCV 91.4 06/19/2022 0805   MCH 30.4 06/19/2022 0805   MCHC 33.3 06/19/2022 0805   RDW 13.0 06/19/2022 0805   LYMPHSABS 1,108 06/19/2022 0805   MONOABS 574 08/07/2016 0858   EOSABS 174 06/19/2022 0805   BASOSABS 41 06/19/2022 0805     Diabetic Labs (most recent): Lab Results  Component Value Date   HGBA1C 5.7 06/18/2011   MICROALBUR 1.6 08/05/2009    Lipid Panel     Component Value Date/Time   CHOL 151 06/19/2022 0805   TRIG 85 06/19/2022 0805   HDL 43 06/19/2022 0805   CHOLHDL 3.5 06/19/2022 0805   VLDL 11 08/07/2016 0858   LDLCALC 90 06/19/2022 0805     Lab Results  Component Value Date   TSH 0.460 10/12/2022   TSH 6.29 (A) 06/24/2022   TSH 6.29 (H) 06/19/2022   TSH 14.100 (H) 04/30/2022   TSH 15.100 (H) 03/02/2022   TSH 23.800 (H) 12/29/2021   TSH 60.300 (H) 11/03/2021   TSH 1.290 08/26/2021   TSH <0.01 (L) 06/05/2021   TSH <0.01 (L) 04/21/2021   FREET4 1.61 10/12/2022   FREET4 1.2 06/19/2022   FREET4 1.28 04/30/2022  FREET4 1.21 03/02/2022   FREET4 1.10 12/29/2021   FREET4 0.57 (L) 11/03/2021   FREET4 0.68 (L) 08/26/2021   FREET4 1.6 06/11/2021   FREET4 1.0 02/17/2021   FREET4 1.1 08/05/2009    Uptake and Scan 06/26/21  CLINICAL DATA:  Hypothyroidism, cessation of levothyroxine therapy 12 weeks ago   EXAM: THYROID SCAN AND UPTAKE - 4 AND 24 HOURS   TECHNIQUE: Following  oral administration of I-123 capsule, anterior planar imaging was acquired at 24 hours. Thyroid uptake was calculated with a thyroid probe at 4-6 hours and 24 hours.   RADIOPHARMACEUTICALS:  Four under 24 uCi I-123 sodium iodide p.o.   COMPARISON:  No relevant prior studies available for comparison at this institution   FINDINGS: Planar imaging the thyroid demonstrates normal homogeneous symmetrical uptake within the thyroid. No focal lesions identified.   4 hour I-123 uptake = 20.3% (normal 5-20%)   24 hour I-123 uptake = 45.7% (normal 10-30%)   IMPRESSION: 1. Elevated iodine uptake values at 4 hours and 24 hours. 2. No focal parenchymal abnormalities identified on imaging.     Electronically Signed   By: Sharlet Salina M.D.   On: 06/26/2021 17:26   Latest Reference Range & Units 03/02/22 11:44 04/30/22 11:24 06/19/22 08:05 06/24/22 00:00 10/12/22 12:03  TSH 0.450 - 4.500 uIU/mL 15.100 (H) 14.100 (H) 6.29 (H) 6.29 ! (E) 0.460  T4,Free(Direct) 0.82 - 1.77 ng/dL 8.11 9.14 1.2  7.82  (H): Data is abnormally high !: Data is abnormal (E): External lab result  Assessment & Plan:   1. Hypothyroidism-s/p RAI for Graves Disease  he is being seen at a kind request of Donita Brooks, MD.  He had RAI for Graves disease on 07/23/21.  -His previsit thyroid function tests are consistent with appropriate hormone replacement.  He is advised to continue his Levothyroxine 175 mcg po daily before breakfast.     - The correct intake of thyroid hormone (Levothyroxine, Synthroid), is on empty stomach first thing in the morning, with water, separated by at least 30 minutes from breakfast and other medications,  and separated by more than 4 hours from calcium, iron, multivitamins, acid reflux medications (PPIs).  - This medication is a life-long medication and will be needed to correct thyroid hormone imbalances for the rest of your life.  The dose may change from time to time, based on  thyroid blood work.  - It is extremely important to be consistent taking this medication, near the same time each morning.  -AVOID TAKING PRODUCTS CONTAINING BIOTIN (commonly found in Hair, Skin, Nails vitamins) AS IT INTERFERES WITH THE VALIDITY OF THYROID FUNCTION BLOOD TESTS.      -Patient is advised to maintain close follow up with Donita Brooks, MD for primary care needs.    I spent  17  minutes in the care of the patient today including review of labs from Thyroid Function, CMP, and other relevant labs ; imaging/biopsy records (current and previous including abstractions from other facilities); face-to-face time discussing  his lab results and symptoms, medications doses, his options of short and long term treatment based on the latest standards of care / guidelines;   and documenting the encounter.  Timothy Jimenez  participated in the discussions, expressed understanding, and voiced agreement with the above plans.  All questions were answered to his satisfaction. he is encouraged to contact clinic should he have any questions or concerns prior to his return visit.   Follow up plan: Return in about  4 months (around 02/16/2023) for Thyroid follow up, Previsit labs.   Thank you for involving me in the care of this pleasant patient, and I will continue to update you with his progress.  Ronny Bacon, [facility] [address] [place], [place] [address] Phone: [phone] Fax: [phone]  10/16/2022, 9:41 AM

## 2022-12-03 ENCOUNTER — Ambulatory Visit
Admission: EM | Admit: 2022-12-03 | Discharge: 2022-12-03 | Disposition: A | Discharge status: Home / Self Care | Payer: Medicare Other | Attending: Family Medicine | Admitting: Family Medicine

## 2022-12-03 DIAGNOSIS — Z1152 Encounter for screening for COVID-19: Secondary | ICD-10-CM | POA: Diagnosis not present

## 2022-12-03 DIAGNOSIS — J4521 Mild intermittent asthma with (acute) exacerbation: Secondary | ICD-10-CM | POA: Insufficient documentation

## 2022-12-03 DIAGNOSIS — Z87891 Personal history of nicotine dependence: Secondary | ICD-10-CM | POA: Insufficient documentation

## 2022-12-03 DIAGNOSIS — J0100 Acute maxillary sinusitis, unspecified: Secondary | ICD-10-CM | POA: Insufficient documentation

## 2022-12-03 DIAGNOSIS — R0981 Nasal congestion: Secondary | ICD-10-CM | POA: Diagnosis present

## 2022-12-03 MED ORDER — PREDNISONE 20 MG PO TABS: 40.0000 mg | ORAL_TABLET | Freq: Every day | ORAL | 0 refills | Status: DC

## 2022-12-03 MED ORDER — DOXYCYCLINE HYCLATE 100 MG PO CAPS: 100.0000 mg | ORAL_CAPSULE | Freq: Two times a day (BID) | ORAL | 0 refills | Status: DC

## 2022-12-03 MED ORDER — ALBUTEROL SULFATE HFA 108 (90 BASE) MCG/ACT IN AERS: 2.0000 | INHALATION_SPRAY | RESPIRATORY_TRACT | 0 refills | Status: DC | PRN

## 2022-12-03 NOTE — ED Provider Notes (Addendum)
[facility]    CSN: [id number] Arrival date & time: 12/03/22  1226      History   Chief Complaint No chief complaint on file.   HPI Timothy Jimenez is a 72 y.o. male.   Patient presenting today with about a week of progressively worsening nasal congestion, sinus pain and pressure, chest tightness, wheezing, productive cough, fatigue.  Denies fever, chest pain, shortness of breath, abdominal pain, nausea vomiting or diarrhea.  History of allergies and asthma on as needed regimen for both.  States his inhaler is giving him very small amounts of relief from the chest tightness.  Also taking Mucinex and Tylenol with minimal relief.  Home COVID test was negative this morning.    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 Ewing Schlein)    ESOPHAGOGASTRODUODENOSCOPY     medium HH esophagitis ( Dr  Ewing Schlein) 02/25/2009    TONSILLECTOMY     20 yoa       Home Medications    Prior to Admission medications   Medication Sig Start Date End Date Taking? Authorizing Provider  albuterol (VENTOLIN HFA) 108 (90 Base) MCG/ACT inhaler Inhale 2 puffs into the lungs every 4 (four) hours as needed for wheezing or shortness of breath. 12/03/22  Yes Particia Nearing, PA-C  doxycycline (VIBRAMYCIN) 100 MG capsule Take 1 capsule (100 mg total) by mouth 2 (two) times daily. 12/03/22  Yes Particia Nearing, PA-C  predniSONE (DELTASONE) 20 MG tablet Take 2 tablets (40 mg total) by mouth daily with breakfast. 12/03/22  Yes Particia Nearing, PA-C  levothyroxine (SYNTHROID) 175 MCG tablet Take 1 tablet (175 mcg total) by mouth daily before breakfast. 10/16/22   Dani Gobble, NP  pantoprazole (PROTONIX) 40 MG tablet Take 40 mg by mouth daily. 12/18/21   [provider]    Family History Family History  Problem Relation Age of Onset   Thyroid disease Mother    Heart failure Mother    Hypertension Mother    Stroke Mother    Kidney disease Father    Hypertension Father    Kidney failure Father    Heart attack Father    COPD Sister    Rheumatic fever Brother        age 56 h/o w heart complications   Hypertension Brother  Stroke Brother    Heart disease Brother    Liver disease Paternal Aunt    Heart disease Other        many cousins deceased MI x2   Hypertension Other        mothers side   Diabetes Other        mothers side   Lung cancer Other        paternal/maternal grandparents   Prostate cancer Neg Hx    Colon cancer Neg Hx     Social History Social History   Tobacco Use   Smoking status: Former    Current packs/day: 0.00    Average packs/day: 0.5 packs/day for 3.0 years (1.5 ttl pk-yrs)    Types: Cigarettes    Start date: 80    Quit date: 56    Years since quitting: 47.6   Smokeless tobacco: Never   Tobacco comments:    stopped amoking 35 years ago  Vaping Use    Vaping status: Never Used  Substance Use Topics   Alcohol use: No   Drug use: No     Allergies   Polycillin [ampicillin]   Review of Systems Review of Systems PER HPI  Physical Exam Triage Vital Signs ED Triage Vitals  Encounter Vitals Group     BP 12/03/22 1233 (!) 154/79     Systolic BP Percentile --      Diastolic BP Percentile --      Pulse Rate 12/03/22 1233 76     Resp 12/03/22 1233 20     Temp 12/03/22 1233 98.5 F (36.9 C)     Temp src --      SpO2 12/03/22 1233 94 %     Weight --      Height --      Head Circumference --      Peak Flow --      Pain Score 12/03/22 1234 6     Pain Loc --      Pain Education --      Exclude from Growth Chart --    No data found.  Updated Vital Signs BP (!) 154/79 (BP Location: Right Arm)   Pulse 76   Temp 98.5 F (36.9 C)   Resp 20   SpO2 94%   Visual Acuity Right Eye Distance:   Left Eye Distance:   Bilateral Distance:    Right Eye Near:   Left Eye Near:    Bilateral Near:     Physical Exam Vitals and nursing note reviewed.  Constitutional:      Appearance: He is well-developed.  HENT:     Head: Atraumatic.     Right Ear: External ear normal.     Left Ear: External ear normal.     Nose: Congestion present.     Mouth/Throat:     Mouth: Mucous membranes are moist.     Pharynx: Posterior oropharyngeal erythema present. No oropharyngeal exudate.  Eyes:     Conjunctiva/sclera: Conjunctivae normal.     Pupils: Pupils are equal, round, and reactive to light.  Cardiovascular:     Rate and Rhythm: Normal rate and regular rhythm.  Pulmonary:     Effort: Pulmonary effort is normal. No respiratory distress.     Breath sounds: Wheezing present. No rales.  Musculoskeletal:        General: Normal range of motion.     Cervical back: Normal range of motion and neck supple.  Lymphadenopathy:     Cervical: No cervical  adenopathy.  Skin:    General: Skin is warm and dry.  Neurological:     Mental Status: He is  alert and oriented to person, place, and time.  Psychiatric:        Behavior: Behavior normal.    UC Treatments / Results  Labs (all labs ordered are listed, but only abnormal results are displayed) Labs Reviewed  SARS CORONAVIRUS 2 (TAT 6-24 HRS)   EKG  Radiology No results found.  Procedures Procedures (including critical care time)  Medications Ordered in UC Medications - No data to display  Initial Impression / Assessment and Plan / UC Course  I have reviewed the triage vital signs and the nursing notes.  Pertinent labs & imaging results that were available during my care of the patient were reviewed by me and considered in my medical decision making (see chart for details).     Given duration worsening course, will treat with doxycycline, prednisone, albuterol, supportive over-the-counter medications and home care.  Discussed return precautions and COVID test pending to see if this is what initially caused his illness though did discuss with him that he is out of window for antiviral therapy and it is now about treatment of the secondary issue Final Clinical Impressions(s) / UC Diagnoses   Final diagnoses:  Acute non-recurrent maxillary sinusitis  Mild intermittent asthma with acute exacerbation   Discharge Instructions   None    ED Prescriptions     Medication Sig Dispense Auth. Provider   predniSONE (DELTASONE) 20 MG tablet Take 2 tablets (40 mg total) by mouth daily with breakfast. 10 tablet Particia Nearing, PA-C   albuterol (VENTOLIN HFA) 108 (90 Base) MCG/ACT inhaler Inhale 2 puffs into the lungs every 4 (four) hours as needed for wheezing or shortness of breath. 18 g Particia Nearing, [place]   doxycycline (VIBRAMYCIN) 100 MG capsule Take 1 capsule (100 mg total) by mouth 2 (two) times daily. 14 capsule Particia Nearing, [place]      PDMP not reviewed this encounter.   Particia Nearing, [place] 12/03/22 1425    [address] [place],  [place] 12/03/22 1426

## 2022-12-03 NOTE — ED Triage Notes (Signed)
Pt reports chest tightness, sneezing, n/v, and sore throat x 1 week.

## 2022-12-04 LAB — SARS CORONAVIRUS 2 (TAT 6-24 HRS): SARS Coronavirus 2: NEGATIVE

## 2023-02-09 LAB — TSH: TSH: 0.376 u[IU]/mL — ABNORMAL LOW (ref 0.450–4.500)

## 2023-02-09 LAB — T4, FREE: Free T4: 1.76 ng/dL (ref 0.82–1.77)

## 2023-02-13 IMAGING — NM NM RAI THERAPY FOR HYPERTHYROIDISM
1 series · 1 of 1 positions shown · non-contrast
Comparison: None.

CLINICAL DATA: Hyperthyroidism

EXAM:
RADIOACTIVE IODINE THERAPY FOR HYPERTHYROIDISM
TECHNIQUE: Radioactive iodine prescribed by myself. The risks and benefits of
radioactive iodine therapy were discussed with the patient in detail
by Dr. Markeith. Alternative therapies were also mentioned. Radiation
safety was discussed with the patient, including how to protect the
general public from exposure. There were no barriers to
communication. Written consent was obtained. The patient then
received a capsule containing the radiopharmaceutical.
The patient will follow-up with the referring physician.
RADIOPHARMACEUTICALS:  20.2 mCi S-7Q7 sodium iodide orally

[Series 1: bone statics · 2.07mm/px · 1 of 1 slices shown]
[im 1/1]
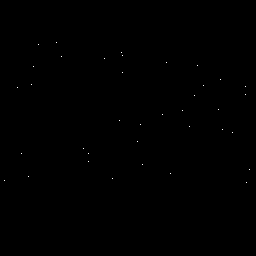

[1 of 1 positions shown; findings below may reference images not displayed]

IMPRESSION: Per oral administration of S-7Q7 sodium iodide for the treatment of
hyperthyroidism.

## 2023-02-15 ENCOUNTER — Ambulatory Visit (INDEPENDENT_AMBULATORY_CARE_PROVIDER_SITE_OTHER): Payer: Medicare Other

## 2023-02-15 DIAGNOSIS — Z23 Encounter for immunization: Secondary | ICD-10-CM

## 2023-02-15 NOTE — Patient Instructions (Signed)

## 2023-02-15 NOTE — Progress Notes (Signed)
Pt here for flu vaccine. Given as per standing order. Pt tolerated well. Mjp,lpn

## 2023-02-16 ENCOUNTER — Encounter: Payer: Self-pay | Admitting: Nurse Practitioner

## 2023-02-16 ENCOUNTER — Ambulatory Visit (INDEPENDENT_AMBULATORY_CARE_PROVIDER_SITE_OTHER): Payer: Medicare Other | Admitting: Nurse Practitioner

## 2023-02-16 VITALS — BP 143/73 | HR 71 | Ht 75.0 in | Wt 258.6 lb

## 2023-02-16 DIAGNOSIS — E890 Postprocedural hypothyroidism: Secondary | ICD-10-CM | POA: Diagnosis not present

## 2023-02-16 MED ORDER — LEVOTHYROXINE SODIUM 175 MCG PO TABS: 175.0000 ug | ORAL_TABLET | Freq: Every day | ORAL | 1 refills | Status: DC

## 2023-02-16 NOTE — Progress Notes (Signed)
02/16/2023     Endocrinology Follow Up Note    Subjective:    Patient ID: Timothy Jimenez, male    DOB: Jun 20, 1950, PCP Donita Brooks, MD.   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 Ewing Schlein)    ESOPHAGOGASTRODUODENOSCOPY     medium HH esophagitis ( Dr  Ewing Schlein) 02/25/2009   TONSILLECTOMY     20 yoa    Social History   Socioeconomic History   Marital status: Married    Spouse name: [name]   Number of children: 2   Years of education: Not on file   Highest education level: Not on file  Occupational History   Not on file  Tobacco Use   Smoking status: Former    Current packs/day: 0.00    Average packs/day: 0.5 packs/day for 3.0 years (1.5 ttl pk-yrs)    Types: Cigarettes    Start date: 53    Quit date: 89    Years since quitting: 47.8   Smokeless tobacco: Never   Tobacco comments:    stopped amoking 35 years ago  Vaping Use   Vaping status: Never Used  Substance and Sexual Activity   Alcohol use: No   Drug use: No   Sexual activity: Yes  Other Topics Concern   Not on file  Social History Narrative   Marital Status: Married 1984 LIVES WITH WIFE   Children: 2 DAUGHTERS OUT OF HOME   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   Social Determinants of Health   Financial Resource Strain: Low Risk  (06/11/2022)   Overall Financial Resource Strain (CARDIA)     Difficulty of Paying Living Expenses: Not hard at all  Food Insecurity: No Food Insecurity (06/11/2022)   Hunger Vital Sign    Worried About Running Out of Food in the Last Year: Never true    Ran Out of Food in the Last Year: Never true  Transportation Needs: No Transportation Needs (06/11/2022)   PRAPARE - Administrator, Civil Service (Medical): No    Lack of Transportation (Non-Medical): No  Physical Activity: Sufficiently Active (06/11/2022)   Exercise Vital Sign    Days of Exercise per Week: 5 days    Minutes of Exercise per Session: 30 min  Stress: No Stress Concern Present (06/11/2022)   [employer] of Occupational Health - Occupational Stress Questionnaire    Feeling of Stress : Not at all  Social Connections: Socially Integrated (06/11/2022)   Social Connection and Isolation Panel [NHANES]    Frequency of Communication with Friends and Family: More than three times a week    Frequency of Social Gatherings with Friends and Family: More than three times a week    Attends Religious Services: More than 4 times per year    Active  Member of Clubs or Organizations: Yes    Attends Engineer, structural: More than 4 times per year    Marital Status: Married    Family History  Problem Relation Age of Onset   Thyroid disease Mother    Heart failure Mother    Hypertension Mother    Stroke Mother    Kidney disease Father    Hypertension Father    Kidney failure Father    Heart attack Father    COPD Sister    Rheumatic fever Brother        age 86 h/o w heart complications   Hypertension Brother    Stroke Brother    Heart disease Brother    Liver disease Paternal Aunt    Heart disease Other        many cousins deceased MI x2   Hypertension Other        mothers side   Diabetes Other        mothers side   Lung cancer Other        paternal/maternal grandparents   Prostate cancer Neg Hx    Colon cancer Neg Hx     Outpatient Encounter Medications as of  02/16/2023  Medication Sig   pantoprazole (PROTONIX) 40 MG tablet Take 40 mg by mouth daily.   [DISCONTINUED] levothyroxine (SYNTHROID) 175 MCG tablet Take 1 tablet (175 mcg total) by mouth daily before breakfast.   levothyroxine (SYNTHROID) 175 MCG tablet Take 1 tablet (175 mcg total) by mouth daily before breakfast.   [DISCONTINUED] albuterol (VENTOLIN HFA) 108 (90 Base) MCG/ACT inhaler Inhale 2 puffs into the lungs every 4 (four) hours as needed for wheezing or shortness of breath. (Patient not taking: Reported on 02/16/2023)   [DISCONTINUED] doxycycline (VIBRAMYCIN) 100 MG capsule Take 1 capsule (100 mg total) by mouth 2 (two) times daily. (Patient not taking: Reported on 02/16/2023)   [DISCONTINUED] predniSONE (DELTASONE) 20 MG tablet Take 2 tablets (40 mg total) by mouth daily with breakfast. (Patient not taking: Reported on 02/16/2023)   No facility-administered encounter medications on file as of 02/16/2023.    ALLERGIES: Allergies  Allergen Reactions   Polycillin [Ampicillin]     Hives     VACCINATION STATUS: Immunization History  Administered Date(s) Administered   Fluad Quad(high Dose 65+) 02/16/2019, 02/12/2022   Fluad Trivalent(High Dose 65+) 02/15/2023   Influenza, High Dose Seasonal PF 01/07/2018   Influenza-Unspecified 02/18/2021   PFIZER(Purple Top)SARS-COV-2 Vaccination 11/17/2019, 12/08/2019   Pfizer Covid-19 Vaccine Bivalent Booster 34yrs & up 05/10/2020   Pneumococcal Conjugate-13 09/01/2016   Pneumococcal Polysaccharide-23 09/03/2015   Td 04/06/2001   Tdap 10/23/2011, 07/02/2018   Zoster Recombinant(Shingrix) 06/17/2022   Zoster, Live 04/06/2013     HPI  Timothy Jimenez is 72 y.o. male who presents today with a medical history as above. he is being seen in follow up after being seen in consultation for hyperthyroidism requested by Donita Brooks, MD.  He was diagnosed years ago with under-active thyroid and had been on thyroid hormone replacement up  until about 12 weeks ago where he was taken off due to undetectable TSH. he has been dealing with symptoms of heat intolerance, fatigue, unintentional weight loss, anxiety, palpitation, diarrhea, and worsening hoarseness for ?the last 6 months but his wife noticed this change starting around 1 year ago. These symptoms are progressively worsening and troubling to him.   He had positive TPO antibodies at 89, indicating autoimmune thyroid dysfunction.  he denies dysphagia, choking, shortness of  breath, but wife has noticed some recent voice change.    he does have family history of thyroid dysfunction in his mother, but denies family hx of thyroid cancer. he denies personal history of goiter. he is not currently on any anti-thyroid medications nor on any thyroid hormone supplements. Denies use of Biotin containing supplements.  He had RAI for Graves disease on 07/23/21.  Review of systems  Constitutional: + stable body weight,  current Body mass index is 32.32 kg/m. , + fatigue- somewhat improved, no subjective hyperthermia, no subjective hypothermia Eyes: no blurry vision, no xerophthalmia ENT: no sore throat, no nodules palpated in throat, no dysphagia/odynophagia, no hoarseness Cardiovascular: no chest pain, no shortness of breath, no palpitations, no leg swelling Respiratory: no cough, no shortness of breath Gastrointestinal: no nausea/vomiting/diarrhea Musculoskeletal: + diffuse muscle/joint aches-stable Skin: no rashes, no hyperemia Neurological: no tremors, no numbness, no tingling, no dizziness Psychiatric: no depression, no anxiety   Objective:    BP (!) 143/73 (BP Location: Left Arm, Patient Position: Sitting, Cuff Size: Large)   Pulse 71   Ht 6\' 3"  (1.905 m)   Wt 258 lb 9.6 oz (117.3 kg)   BMI 32.32 kg/m   Wt Readings from Last 3 Encounters:  02/16/23 258 lb 9.6 oz (117.3 kg)  10/16/22 255 lb (115.7 kg)  07/07/22 259 lb 12.8 oz (117.8 kg)     BP Readings from Last 3  Encounters:  02/16/23 (!) 143/73  12/03/22 (!) 154/79  10/16/22 (!) 140/86                         Physical Exam- Limited  Constitutional:  Body mass index is 32.32 kg/m. , not in acute distress, normal state of mind Eyes:  EOMI, no exophthalmos Musculoskeletal: no gross deformities, strength intact in all four extremities, no gross restriction of joint movements Skin:  no rashes, no hyperemia Neurological: no tremor with outstretched hands   CMP     Component Value Date/Time   NA 141 06/19/2022 0805   K 4.0 06/19/2022 0805   CL 106 06/19/2022 0805   CO2 25 06/19/2022 0805   GLUCOSE 102 (H) 06/19/2022 0805   BUN 17 06/29/2022 0000   CREATININE 1.3 06/29/2022 0000   CREATININE 1.26 06/19/2022 0805   CALCIUM 8.8 06/19/2022 0805   PROT 6.7 06/19/2022 0805   ALBUMIN 4.0 06/29/2022 0000   AST 15 06/19/2022 0805   ALT 13 06/29/2022 0000   ALKPHOS 91 06/29/2022 0000   BILITOT 0.5 06/19/2022 0805   GFRNONAA 62 08/26/2020 1452   GFRAA 71 08/26/2020 1452     CBC    Component Value Date/Time   WBC 5.8 06/29/2022 0000   WBC 5.8 06/19/2022 0805   RBC 4.44 06/29/2022 0000   HGB 13.5 06/19/2022 0805   HCT 40.6 06/19/2022 0805   PLT 196 06/19/2022 0805   MCV 91.4 06/19/2022 0805   MCH 30.4 06/19/2022 0805   MCHC 33.3 06/19/2022 0805   RDW 13.0 06/19/2022 0805   LYMPHSABS 1,108 06/19/2022 0805   MONOABS 574 08/07/2016 0858   EOSABS 174 06/19/2022 0805   BASOSABS 41 06/19/2022 0805     Diabetic Labs (most recent): Lab Results  Component Value Date   HGBA1C 5.7 06/18/2011   MICROALBUR 1.6 08/05/2009    Lipid Panel     Component Value Date/Time   CHOL 151 06/19/2022 0805   TRIG 85 06/19/2022 0805   HDL 43 06/19/2022 0805   CHOLHDL  3.5 06/19/2022 0805   VLDL 11 08/07/2016 0858   LDLCALC 90 06/19/2022 0805     Lab Results  Component Value Date   TSH 0.376 (L) 02/08/2023   TSH 0.460 10/12/2022   TSH 6.29 (A) 06/24/2022   TSH 6.29 (H) 06/19/2022   TSH  14.100 (H) 04/30/2022   TSH 15.100 (H) 03/02/2022   TSH 23.800 (H) 12/29/2021   TSH 60.300 (H) 11/03/2021   TSH 1.290 08/26/2021   TSH <0.01 (L) 06/05/2021   FREET4 1.76 02/08/2023   FREET4 1.61 10/12/2022   FREET4 1.2 06/19/2022   FREET4 1.28 04/30/2022   FREET4 1.21 03/02/2022   FREET4 1.10 12/29/2021   FREET4 0.57 (L) 11/03/2021   FREET4 0.68 (L) 08/26/2021   FREET4 1.6 06/11/2021   FREET4 1.0 02/17/2021    Uptake and Scan 06/26/21  CLINICAL DATA:  Hypothyroidism, cessation of levothyroxine therapy 12 weeks ago   EXAM: THYROID SCAN AND UPTAKE - 4 AND 24 HOURS   TECHNIQUE: Following oral administration of I-123 capsule, anterior planar imaging was acquired at 24 hours. Thyroid uptake was calculated with a thyroid probe at 4-6 hours and 24 hours.   RADIOPHARMACEUTICALS:  Four under 24 uCi I-123 sodium iodide p.o.   COMPARISON:  No relevant prior studies available for comparison at this institution   FINDINGS: Planar imaging the thyroid demonstrates normal homogeneous symmetrical uptake within the thyroid. No focal lesions identified.   4 hour I-123 uptake = 20.3% (normal 5-20%)   24 hour I-123 uptake = 45.7% (normal 10-30%)   IMPRESSION: 1. Elevated iodine uptake values at 4 hours and 24 hours. 2. No focal parenchymal abnormalities identified on imaging.     Electronically Signed   By: Sharlet Salina M.D.   On: 06/26/2021 17:26   Latest Reference Range & Units 03/02/22 11:44 04/30/22 11:24 06/19/22 08:05 06/24/22 00:00 10/12/22 12:03 02/08/23 10:49  TSH 0.450 - 4.500 uIU/mL 15.100 (H) 14.100 (H) 6.29 (H) 6.29 ! (E) 0.460 0.376 (L)  T4,Free(Direct) 0.82 - 1.77 ng/dL 4.13 2.44 1.2  0.10 2.72  (H): Data is abnormally high !: Data is abnormal (L): Data is abnormally low (E): External lab result  Assessment & Plan:   1. Hypothyroidism-s/p RAI for Graves Disease  he is being seen at a kind request of Donita Brooks, MD.  He had RAI for Graves disease on  07/23/21.  -His previsit thyroid function tests are consistent with appropriate hormone replacement.  He is advised to continue his Levothyroxine 175 mcg po daily before breakfast.     - The correct intake of thyroid hormone (Levothyroxine, Synthroid), is on empty stomach first thing in the morning, with water, separated by at least 30 minutes from breakfast and other medications,  and separated by more than 4 hours from calcium, iron, multivitamins, acid reflux medications (PPIs).  - This medication is a life-long medication and will be needed to correct thyroid hormone imbalances for the rest of your life.  The dose may change from time to time, based on thyroid blood work.  - It is extremely important to be consistent taking this medication, near the same time each morning.  -AVOID TAKING PRODUCTS CONTAINING BIOTIN (commonly found in Hair, Skin, Nails vitamins) AS IT INTERFERES WITH THE VALIDITY OF THYROID FUNCTION BLOOD TESTS.      -Patient is advised to maintain close follow up with Donita Brooks, MD for primary care needs.    I spent  14  minutes in the care of the patient  today including review of labs from Thyroid Function, CMP, and other relevant labs ; imaging/biopsy records (current and previous including abstractions from other facilities); face-to-face time discussing  his lab results and symptoms, medications doses, his options of short and long term treatment based on the latest standards of care / guidelines;   and documenting the encounter.  Timothy Jimenez  participated in the discussions, expressed understanding, and voiced agreement with the above plans.  All questions were answered to his satisfaction. he is encouraged to contact clinic should he have any questions or concerns prior to his return visit.   Follow up plan: Return in 4 months (on 06/16/2023) for Thyroid follow up, Previsit labs.   Thank you for involving me in the care of this pleasant patient, and I  will continue to update you with his progress.  Ronny Bacon, [facility] [address] [place], [place] [address] Phone: [phone] Fax: [phone]  02/16/2023, 10:34 AM

## 2023-05-12 ENCOUNTER — Other Ambulatory Visit: Payer: Self-pay | Admitting: Family Medicine

## 2023-05-12 NOTE — Telephone Encounter (Signed)
 Last Fill: 12/18/21  Last OV: 06/23/22 Next OV: 06/17/23 AWV  Routing to provider for review/authorization.

## 2023-05-12 NOTE — Telephone Encounter (Signed)
 Copied from CRM [id number]. Topic: Clinical - Medication Refill >> May 12, 2023 10:31 AM Suzette B wrote: Most Recent Primary Care Visit:  Provider: PERDUE, MARY JANE K  Department: [facility]  Visit Type: CLINICAL SUPPORT  Date: 02/15/2023  Medication:  pantoprazole  (PROTONIX ) 40 MG tablet    Has the patient contacted their pharmacy? No (Agent: If no, request that the patient contact the pharmacy for the refill. If patient does not wish to contact the pharmacy document the reason why and proceed with request.) He stated that he was on his wife's [employer], but no longer can get the medication using her plan (Agent: If yes, when and what did the pharmacy advise?)  Is this the correct pharmacy for this prescription? Yes If no, delete pharmacy and type the correct one.   This is the patient's preferred pharmacy:  [facility] [address], [place] - [address] [place] [address] [place] [address] Phone: [phone] Fax: [phone]   Has the prescription been filled recently? No  Is the patient out of the medication? Yes  Has the patient been seen for an appointment in the last year OR does the patient have an upcoming appointment? No  Can we respond through MyChart? No  Agent: Please be advised that Rx refills may take up to 3 business days. We ask that you follow-up with your pharmacy.

## 2023-05-17 ENCOUNTER — Encounter: Payer: Self-pay | Admitting: Family Medicine

## 2023-05-17 ENCOUNTER — Ambulatory Visit (INDEPENDENT_AMBULATORY_CARE_PROVIDER_SITE_OTHER): Payer: Medicare Other | Admitting: Family Medicine

## 2023-05-17 VITALS — BP 132/72 | HR 68 | Temp 97.6°F | Ht 75.0 in | Wt 261.4 lb

## 2023-05-17 DIAGNOSIS — Z13220 Encounter for screening for lipoid disorders: Secondary | ICD-10-CM

## 2023-05-17 DIAGNOSIS — N401 Enlarged prostate with lower urinary tract symptoms: Secondary | ICD-10-CM

## 2023-05-17 DIAGNOSIS — E890 Postprocedural hypothyroidism: Secondary | ICD-10-CM | POA: Diagnosis not present

## 2023-05-17 DIAGNOSIS — K219 Gastro-esophageal reflux disease without esophagitis: Secondary | ICD-10-CM

## 2023-05-17 DIAGNOSIS — R350 Frequency of micturition: Secondary | ICD-10-CM

## 2023-05-17 MED ORDER — LEVOTHYROXINE SODIUM 175 MCG PO TABS: 175.0000 ug | ORAL_TABLET | Freq: Every day | ORAL | 3 refills | Status: DC

## 2023-05-17 MED ORDER — PANTOPRAZOLE SODIUM 40 MG PO TBEC: 40.0000 mg | DELAYED_RELEASE_TABLET | Freq: Every day | ORAL | 3 refills | Status: AC

## 2023-05-17 NOTE — Progress Notes (Signed)
 Subjective:    Patient ID: Timothy Jimenez, male    DOB: February 25, 1951, 73 y.o.   MRN: [id number]  Patient is a very pleasant 73 year old Caucasian gentleman here today for checkup.  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  He is due to recheck a TSH.  His cholesterol numbers have been excellent.  He does have a family history of coronary artery disease in his uncle in his 67s.  Patient quit smoking over 40 years ago.  He denies any chest pain shortness of breath or dyspnea on exertion.  He is due for prostate cancer screening.  He does report occasional frequency.  His colonoscopy is up-to-date. Past Medical History:  Diagnosis Date   Allergic rhinitis 1999   Asthma    GERD (gastroesophageal reflux disease) 2002   History of pneumonia 1997   Hypothyroidism 2002   Other abnormal clinical finding    hosptilalized MCH r/o viral age 73-19/2002   SBE (subacute bacterial endocarditis)    2nd to heart murmur (Dr Linder Revere)   Syncope    single episode 2012 thought to be vagal episode w/o return of symtpoms   Past Surgical History:  Procedure Laterality Date   APPENDECTOMY     73 years old   CARDIAC CATHETERIZATION     COLONOSCOPY  02/25/2009   sm int and ext hemms (Dr. Lavaughn [place])    ESOPHAGOGASTRODUODENOSCOPY     medium HH esophagitis ( Dr  Lavaughn [place]) 02/25/2009   TONSILLECTOMY     73 yoa   No current outpatient medications on file prior to visit.   No current facility-administered medications on file prior to visit.    Allergies  Allergen Reactions   Polycillin [Ampicillin]     Hives    Social History   Socioeconomic History   Marital status: Married    Spouse name: [name]   Number of children: 2   Years of education: Not on file   Highest education level: Not on file  Occupational History   Not on file  Tobacco Use   Smoking status: Former    Current packs/day: 0.00    Average packs/day: 0.5 packs/day for 3.0 years (1.5 ttl pk-yrs)     Types: Cigarettes    Start date: 20    Quit date: 34    Years since quitting: 48.1   Smokeless tobacco: Never   Tobacco comments:    stopped amoking 35 years ago  Vaping Use   Vaping status: Never Used  Substance and Sexual Activity   Alcohol use: No   Drug use: No   Sexual activity: Yes  Other Topics Concern   Not on file  Social History Narrative   Marital Status: Married 1984 LIVES WITH WIFE   Children: 2 DAUGHTERS OUT OF HOME   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   Social Drivers of Health   Financial Resource Strain: Low Risk  (06/11/2022)   Overall Financial Resource Strain (CARDIA)    Difficulty of Paying Living Expenses: Not hard at all  Food Insecurity: No Food Insecurity (06/11/2022)   Hunger Vital Sign    Worried About Running Out of Food in the Last Year: Never true    Ran Out of Food in the Last Year: Never true  Transportation Needs: No Transportation Needs (06/11/2022)   PRAPARE - Administrator, Civil Service (Medical): No    Lack of Transportation (Non-Medical): No  Physical Activity: Sufficiently Active (06/11/2022)   Exercise Vital Sign    Days of Exercise per Week: 5 days    Minutes of Exercise per Session: 30 min  Stress: No Stress Concern Present (06/11/2022)   [employer] of Occupational Health - Occupational Stress Questionnaire    Feeling of Stress : Not at all  Social Connections: Socially Integrated (06/11/2022)   Social Connection and Isolation Panel [NHANES]    Frequency of Communication with Friends and Family: More than three times a week    Frequency of Social Gatherings with Friends and Family: More than three times a week    Attends Religious Services: More than 4 times per year    Active Member of [employer] or Organizations: Yes    Attends Engineer, structural: More than 4 times per year    Marital Status: Married  Catering manager Violence: Not At Risk (06/11/2022)    Humiliation, Afraid, Rape, and Kick questionnaire    Fear of Current or Ex-Partner: No    Emotionally Abused: No    Physically Abused: No    Sexually Abused: No   Family History  Problem Relation Age of Onset   Thyroid  disease Mother    Heart failure Mother    Hypertension Mother    Stroke Mother    Kidney disease Father    Hypertension Father    Kidney failure Father    Heart attack Father    COPD Sister    Rheumatic fever Brother        age 28 h/o w heart complications   Hypertension Brother    Stroke Brother    Heart disease Brother    Liver disease Paternal Aunt    Heart disease Other        many cousins deceased MI x2   Hypertension Other        mothers side   Diabetes Other        mothers side   Lung cancer Other        paternal/maternal grandparents   Prostate cancer Neg Hx    Colon cancer Neg Hx       Review of Systems  All other systems reviewed and are negative.      Objective:   Physical Exam Vitals reviewed.  Constitutional:      General: He is not in acute distress.    Appearance: He is well-developed. He is not diaphoretic.  HENT:     Head: Normocephalic and atraumatic.     Nose: Nose normal.  Eyes:     General: No scleral icterus.       Right eye: No discharge.        Left eye: No discharge.     Conjunctiva/sclera: Conjunctivae normal.     Pupils: Pupils are equal, round, and reactive to light.  Neck:     Thyroid : No thyromegaly.     Vascular: No JVD.     Trachea: No tracheal deviation.  Cardiovascular:     Rate and Rhythm: Normal rate and regular rhythm.     Heart sounds: Normal heart sounds. No murmur heard.    No friction rub. No gallop.  Pulmonary:     Effort: Pulmonary effort is normal. No respiratory distress.     Breath sounds: Normal breath sounds. No stridor. No wheezing or rales.  Chest:     Chest wall: No tenderness.  Genitourinary:    Penis: Normal.      Prostate: Normal.     Rectum:  Normal.  Musculoskeletal:         General: No tenderness. Normal range of motion.     Cervical back: Normal range of motion and neck supple.  Lymphadenopathy:     Cervical: No cervical adenopathy.  Skin:    General: Skin is warm.     Coloration: Skin is not pale.  Neurological:     Mental Status: He is alert and oriented to person, place, and time.     Cranial Nerves: No cranial nerve deficit.     Motor: No abnormal muscle tone.     Coordination: Coordination normal.     Deep Tendon Reflexes: Reflexes are normal and symmetric.  Psychiatric:        Behavior: Behavior normal.        Thought Content: Thought content normal.        Judgment: Judgment normal.           Assessment & Plan:  Postablative hypothyroidism - Plan: levothyroxine  (SYNTHROID ) 175 MCG tablet, CBC with Differential/Platelet, COMPLETE METABOLIC PANEL WITH GFR, Lipid panel, TSH, CT CARDIAC SCORING (SELF PAY ONLY)  Gastroesophageal reflux disease, unspecified whether esophagitis present - Plan: pantoprazole  (PROTONIX ) 40 MG tablet  Screening cholesterol level - Plan: CBC with Differential/Platelet, COMPLETE METABOLIC PANEL WITH GFR, Lipid panel  Benign prostatic hyperplasia with urinary frequency - Plan: PSA Patient's blood pressure today is excellent.  I will screen for prostate cancer with a PSA.  I will check a TSH to ensure that his levothyroxine  is at the appropriate dose.  Colon cancer screening is up-to-date.  I will screen the patient for cholesterol by checking a fasting lipid panel although his previous cholesterol levels have been excellent.  We discussed a coronary artery calcium  score and the patient would like to get this to risk stratify himself to determine whether or not we need to treat his cholesterol more aggressively

## 2023-05-18 LAB — COMPLETE METABOLIC PANEL WITH GFR
AG Ratio: 1.7 (calc) (ref 1.0–2.5)
ALT: 17 U/L (ref 9–46)
AST: 16 U/L (ref 10–35)
Albumin: 4.3 g/dL (ref 3.6–5.1)
Alkaline phosphatase (APISO): 93 U/L (ref 35–144)
BUN: 15 mg/dL (ref 7–25)
CO2: 27 mmol/L (ref 20–32)
Calcium: 9.2 mg/dL (ref 8.6–10.3)
Chloride: 105 mmol/L (ref 98–110)
Creat: 1.08 mg/dL (ref 0.70–1.28)
Globulin: 2.5 g/dL (ref 1.9–3.7)
Glucose, Bld: 102 mg/dL — ABNORMAL HIGH (ref 65–99)
Potassium: 4.3 mmol/L (ref 3.5–5.3)
Sodium: 140 mmol/L (ref 135–146)
Total Bilirubin: 0.4 mg/dL (ref 0.2–1.2)
Total Protein: 6.8 g/dL (ref 6.1–8.1)
eGFR: 73 mL/min/{1.73_m2} (ref >=60)

## 2023-05-18 LAB — LIPID PANEL
Cholesterol: 158 mg/dL (ref <200)
HDL: 43 mg/dL (ref >=40)
LDL Cholesterol (Calc): 95 mg/dL
Non-HDL Cholesterol (Calc): 115 mg/dL (ref <130)
Total CHOL/HDL Ratio: 3.7 (calc) (ref <5.0)
Triglycerides: 107 mg/dL (ref <150)

## 2023-05-18 LAB — TSH: TSH: 0.06 m[IU]/L — ABNORMAL LOW (ref 0.40–4.50)

## 2023-05-18 LAB — CBC WITH DIFFERENTIAL/PLATELET
Absolute Lymphocytes: 1300 {cells}/uL (ref 850–3900)
Absolute Monocytes: 855 {cells}/uL (ref 200–950)
Basophils Absolute: 40 {cells}/uL (ref 0–200)
Basophils Relative: 0.7 %
Eosinophils Absolute: 171 {cells}/uL (ref 15–500)
Eosinophils Relative: 3 %
HCT: 40.8 % (ref 38.5–50.0)
Hemoglobin: 13.9 g/dL (ref 13.2–17.1)
MCH: 30.5 pg (ref 27.0–33.0)
MCHC: 34.1 g/dL (ref 32.0–36.0)
MCV: 89.7 fL (ref 80.0–100.0)
MPV: 10.2 fL (ref 7.5–12.5)
Monocytes Relative: 15 %
Neutro Abs: 3335 {cells}/uL (ref 1500–7800)
Neutrophils Relative %: 58.5 %
Platelets: 211 10*3/uL (ref 140–400)
RBC: 4.55 10*6/uL (ref 4.20–5.80)
RDW: 13 % (ref 11.0–15.0)
Total Lymphocyte: 22.8 %
WBC: 5.7 10*3/uL (ref 3.8–10.8)

## 2023-05-18 LAB — PSA: PSA: 1.72 ng/mL (ref <=4.00)

## 2023-06-01 ENCOUNTER — Ambulatory Visit (HOSPITAL_COMMUNITY)
Admission: RE | Admit: 2023-06-01 | Discharge: 2023-06-01 | Disposition: A | Discharge status: Home / Self Care | Payer: Self-pay | Source: Ambulatory Visit | Attending: Family Medicine | Admitting: Family Medicine

## 2023-06-01 DIAGNOSIS — E890 Postprocedural hypothyroidism: Secondary | ICD-10-CM | POA: Insufficient documentation

## 2023-06-02 ENCOUNTER — Other Ambulatory Visit: Payer: Self-pay

## 2023-06-02 DIAGNOSIS — E039 Hypothyroidism, unspecified: Secondary | ICD-10-CM

## 2023-06-02 MED ORDER — LEVOTHYROXINE SODIUM 150 MCG PO TABS
150.0000 ug | ORAL_TABLET | Freq: Every day | ORAL | 1 refills | Status: DC
Start: 2023-06-02 — End: 2023-09-23

## 2023-06-04 ENCOUNTER — Encounter: Payer: Self-pay | Admitting: Family Medicine

## 2023-06-04 ENCOUNTER — Ambulatory Visit (INDEPENDENT_AMBULATORY_CARE_PROVIDER_SITE_OTHER): Payer: Medicare Other | Admitting: Family Medicine

## 2023-06-04 ENCOUNTER — Ambulatory Visit: Payer: Self-pay | Admitting: Family Medicine

## 2023-06-04 VITALS — BP 142/68 | HR 71 | Temp 97.7°F | Ht 75.0 in | Wt 260.0 lb

## 2023-06-04 DIAGNOSIS — S81801S Unspecified open wound, right lower leg, sequela: Secondary | ICD-10-CM

## 2023-06-04 DIAGNOSIS — L03115 Cellulitis of right lower limb: Secondary | ICD-10-CM

## 2023-06-04 MED ORDER — ATORVASTATIN CALCIUM 20 MG PO TABS: 20.0000 mg | ORAL_TABLET | Freq: Every day | ORAL | 3 refills | Status: AC

## 2023-06-04 MED ORDER — CEPHALEXIN 500 MG PO CAPS: 500.0000 mg | ORAL_CAPSULE | Freq: Three times a day (TID) | ORAL | 0 refills | Status: DC

## 2023-06-04 MED ORDER — SILVER SULFADIAZINE 1 % EX CREA: 1.0000 | TOPICAL_CREAM | Freq: Every day | CUTANEOUS | 0 refills | Status: AC
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
## 2023-06-08 ENCOUNTER — Telehealth: Payer: Self-pay | Admitting: Family Medicine

## 2023-06-08 NOTE — Telephone Encounter (Signed)
 Copied from CRM [id number]. Topic: Referral - Status >> Jun 08, 2023  2:55 PM Marland Kitchen D wrote:  Patient got a referral and wants a update on it. I let patient know the referral was put  in but he wants more information on it.

## 2023-06-09 ENCOUNTER — Telehealth: Payer: Self-pay | Admitting: Nurse Practitioner

## 2023-06-09 NOTE — Telephone Encounter (Signed)
 Yes, I will need him to repeat.  Looks like his PCP changed his levothyroxine dose too.

## 2023-06-09 NOTE — Telephone Encounter (Signed)
 Pt did bloodwork at primary care, T4 Free was not done, does he need to redo labs to add that one or can you use what he has done?

## 2023-06-09 NOTE — Telephone Encounter (Signed)
 Pt was made aware.

## 2023-06-10 ENCOUNTER — Telehealth: Payer: Self-pay

## 2023-06-10 LAB — T4, FREE: Free T4: 1.76 ng/dL (ref 0.82–1.77)

## 2023-06-10 LAB — TSH: TSH: 0.058 u[IU]/mL — ABNORMAL LOW (ref 0.450–4.500)

## 2023-06-10 NOTE — Telephone Encounter (Signed)
 Reminder letter mailed for patient

## 2023-06-14 NOTE — Patient Instructions (Signed)

## 2023-06-16 ENCOUNTER — Encounter: Payer: Self-pay | Admitting: Nurse Practitioner

## 2023-06-16 ENCOUNTER — Ambulatory Visit (INDEPENDENT_AMBULATORY_CARE_PROVIDER_SITE_OTHER): Payer: Medicare Other | Admitting: Nurse Practitioner

## 2023-06-16 VITALS — BP 130/80 | HR 66 | Ht 75.0 in | Wt 257.8 lb

## 2023-06-16 DIAGNOSIS — E890 Postprocedural hypothyroidism: Secondary | ICD-10-CM | POA: Diagnosis not present
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
## 2023-06-17 ENCOUNTER — Ambulatory Visit: Payer: Medicare Other

## 2023-06-17 DIAGNOSIS — Z0000 Encounter for general adult medical examination without abnormal findings: Secondary | ICD-10-CM | POA: Diagnosis not present

## 2023-06-17 NOTE — Patient Instructions (Signed)
 [name] , Thank you for taking time to come for your Medicare Wellness Visit. I appreciate your ongoing commitment to your health goals. Please review the following plan we discussed and let me know if I can assist you in the future.   Screening recommendations/referrals: Colonoscopy: up to date Recommended yearly ophthalmology/optometry visit for glaucoma screening and checkup Recommended yearly dental visit for hygiene and checkup  Vaccinations: Influenza vaccine: up to date Pneumococcal vaccine: up to date Tdap vaccine: up to date     Preventive Care 65 Years and Older, Male Preventive care refers to lifestyle choices and visits with your health care provider that can promote health and wellness. What does preventive care include? A yearly physical exam. This is also called an annual well check. Dental exams once or twice a year. Routine eye exams. Ask your health care provider how often you should have your eyes checked. Personal lifestyle choices, including: Daily care of your teeth and gums. Regular physical activity. Eating a healthy diet. Avoiding tobacco and drug use. Limiting alcohol use. Practicing safe sex. Taking low doses of aspirin every day. Taking vitamin and mineral supplements as recommended by your health care provider. What happens during an annual well check? The services and screenings done by your health care provider during your annual well check will depend on your age, overall health, lifestyle risk factors, and family history of disease. Counseling  Your health care provider may ask you questions about your: Alcohol use. Tobacco use. Drug use. Emotional well-being. Home and relationship well-being. Sexual activity. Eating habits. History of falls. Memory and ability to understand (cognition). Work and work Astronomer. Screening  You may have the following tests or measurements: Height, weight, and BMI. Blood pressure. Lipid and  cholesterol levels. These may be checked every 5 years, or more frequently if you are over 26 years old. Skin check. Lung cancer screening. You may have this screening every year starting at age 71 if you have a 30-pack-year history of smoking and currently smoke or have quit within the past 15 years. Fecal occult blood test (FOBT) of the stool. You may have this test every year starting at age 63. Flexible sigmoidoscopy or colonoscopy. You may have a sigmoidoscopy every 5 years or a colonoscopy every 10 years starting at age 46. Prostate cancer screening. Recommendations will vary depending on your family history and other risks. Hepatitis C blood test. Hepatitis B blood test. Sexually transmitted disease (STD) testing. Diabetes screening. This is done by checking your blood sugar (glucose) after you have not eaten for a while (fasting). You may have this done every 1-3 years. Abdominal aortic aneurysm (AAA) screening. You may need this if you are a current or former smoker. Osteoporosis. You may be screened starting at age 37 if you are at high risk. Talk with your health care provider about your test results, treatment options, and if necessary, the need for more tests. Vaccines  Your health care provider may recommend certain vaccines, such as: Influenza vaccine. This is recommended every year. Tetanus, diphtheria, and acellular pertussis (Tdap, Td) vaccine. You may need a Td booster every 10 years. Zoster vaccine. You may need this after age 19. Pneumococcal 13-valent conjugate (PCV13) vaccine. One dose is recommended after age 68. Pneumococcal polysaccharide (PPSV23) vaccine. One dose is recommended after age 81. Talk to your health care provider about which screenings and vaccines you need and how often you need them. This information is not intended to replace advice given to you  by your health care provider. Make sure you discuss any questions you have with your health care  provider. Document Released: 04/19/2015 Document Revised: 12/11/2015 Document Reviewed: 01/22/2015 Elsevier Interactive Patient Education  2017 [employer].  Fall Prevention in the Home Falls can cause injuries. They can happen to people of all ages. There are many things you can do to make your home safe and to help prevent falls. What can I do on the outside of my home? Regularly fix the edges of walkways and driveways and fix any cracks. Remove anything that might make you trip as you walk through a door, such as a raised step or threshold. Trim any bushes or trees on the path to your home. Use bright outdoor lighting. Clear any walking paths of anything that might make someone trip, such as rocks or tools. Regularly check to see if handrails are loose or broken. Make sure that both sides of any steps have handrails. Any raised decks and porches should have guardrails on the edges. Have any leaves, snow, or ice cleared regularly. Use sand or salt on walking paths during winter. Clean up any spills in your garage right away. This includes oil or grease spills. What can I do in the bathroom? Use night lights. Install grab bars by the toilet and in the tub and shower. Do not use towel bars as grab bars. Use non-skid mats or decals in the tub or shower. If you need to sit down in the shower, use a plastic, non-slip stool. Keep the floor dry. Clean up any water that spills on the floor as soon as it happens. Remove soap buildup in the tub or shower regularly. Attach bath mats securely with double-sided non-slip rug tape. Do not have throw rugs and other things on the floor that can make you trip. What can I do in the bedroom? Use night lights. Make sure that you have a light by your bed that is easy to reach. Do not use any sheets or blankets that are too big for your bed. They should not hang down onto the floor. Have a firm chair that has side arms. You can use this for support while  you get dressed. Do not have throw rugs and other things on the floor that can make you trip. What can I do in the kitchen? Clean up any spills right away. Avoid walking on wet floors. Keep items that you use a lot in easy-to-reach places. If you need to reach something above you, use a strong step stool that has a grab bar. Keep electrical cords out of the way. Do not use floor polish or wax that makes floors slippery. If you must use wax, use non-skid floor wax. Do not have throw rugs and other things on the floor that can make you trip. What can I do with my stairs? Do not leave any items on the stairs. Make sure that there are handrails on both sides of the stairs and use them. Fix handrails that are broken or loose. Make sure that handrails are as long as the stairways. Check any carpeting to make sure that it is firmly attached to the stairs. Fix any carpet that is loose or worn. Avoid having throw rugs at the top or bottom of the stairs. If you do have throw rugs, attach them to the floor with carpet tape. Make sure that you have a light switch at the top of the stairs and the bottom of the stairs. If  you do not have them, ask someone to add them for you. What else can I do to help prevent falls? Wear shoes that: Do not have high heels. Have rubber bottoms. Are comfortable and fit you well. Are closed at the toe. Do not wear sandals. If you use a stepladder: Make sure that it is fully opened. Do not climb a closed stepladder. Make sure that both sides of the stepladder are locked into place. Ask someone to hold it for you, if possible. Clearly mark and make sure that you can see: Any grab bars or handrails. First and last steps. Where the edge of each step is. Use tools that help you move around (mobility aids) if they are needed. These include: Canes. Walkers. Scooters. Crutches. Turn on the lights when you go into a dark area. Replace any light bulbs as soon as they burn  out. Set up your furniture so you have a clear path. Avoid moving your furniture around. If any of your floors are uneven, fix them. If there are any pets around you, be aware of where they are. Review your medicines with your doctor. Some medicines can make you feel dizzy. This can increase your chance of falling. Ask your doctor what other things that you can do to help prevent falls. This information is not intended to replace advice given to you by your health care provider. Make sure you discuss any questions you have with your health care provider. Document Released: 01/17/2009 Document Revised: 08/29/2015 Document Reviewed: 04/27/2014 Elsevier Interactive Patient Education  2017 [employer].

## 2023-06-17 NOTE — Progress Notes (Signed)
 Subjective:   Timothy Jimenez is a 73 y.o. male who presents for Medicare Annual/Subsequent preventive examination.  Visit Complete: Virtual I connected with  Timothy Jimenez on 06/17/23 by a audio enabled telemedicine application and verified that I am speaking with the correct person using two identifiers.  Patient Location: Home  Provider Location: Home Office  I discussed the limitations of evaluation and management by telemedicine. The patient expressed understanding and agreed to proceed.  Vital Signs: Because this visit was a virtual/telehealth visit, some criteria may be missing or patient reported. Any vitals not documented were not able to be obtained and vitals that have been documented are patient reported.   Cardiac Risk Factors include: advanced age (>87men, >33 women);male gender;obesity (BMI >30kg/m2);family history of premature cardiovascular disease     Objective:    There were no vitals filed for this visit. There is no height or weight on file to calculate BMI.     06/17/2023   12:21 PM 06/11/2022    9:42 AM 06/05/2021    8:30 AM 07/02/2018    2:26 PM  Advanced Directives  Does Patient Have a Medical Advance Directive? No No No No  Would patient like information on creating a medical advance directive? No - Patient declined Yes (MAU/Ambulatory/Procedural Areas - Information given) No - Patient declined     Current Medications (verified) Outpatient Encounter Medications as of 06/17/2023  Medication Sig   atorvastatin (LIPITOR) 20 MG tablet Take 1 tablet (20 mg total) by mouth daily.   levothyroxine (SYNTHROID) 150 MCG tablet Take 1 tablet (150 mcg total) by mouth daily.   pantoprazole (PROTONIX) 40 MG tablet Take 1 tablet (40 mg total) by mouth daily.   silver sulfADIAZINE (SILVADENE) 1 % cream Apply 1 Application topically daily.   cephALEXin (KEFLEX) 500 MG capsule Take 1 capsule (500 mg total) by mouth 3 (three) times daily. (Patient not taking: Reported on  06/17/2023)   No facility-administered encounter medications on file as of 06/17/2023.    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 Ewing Schlein)    ESOPHAGOGASTRODUODENOSCOPY     medium HH esophagitis ( Dr  Ewing Schlein) 02/25/2009   TONSILLECTOMY     20 yoa   Family History  Problem Relation Age of Onset   Thyroid disease Mother    Heart failure Mother    Hypertension Mother    Stroke Mother    Kidney disease Father    Hypertension Father    Kidney failure Father    Heart attack Father    COPD Sister    Rheumatic fever Brother        age 79 h/o w heart complications   Hypertension Brother    Stroke Brother    Heart disease Brother    Liver disease Paternal Aunt  Heart disease Other        many cousins deceased MI x2   Hypertension Other        mothers side   Diabetes Other        mothers side   Lung cancer Other        paternal/maternal grandparents   Prostate cancer Neg Hx    Colon cancer Neg Hx    Social History   Socioeconomic History   Marital status: Married    Spouse name: Timothy Jimenez   Number of children: 2   Years of education: Not on file   Highest education level: Not on file  Occupational History   Not on file  Tobacco Use   Smoking status: Former    Current packs/day: 0.00    Average packs/day: 0.5 packs/day for 3.0 years (1.5 ttl pk-yrs)    Types: Cigarettes     Start date: 53    Quit date: 87    Years since quitting: 48.2   Smokeless tobacco: Never   Tobacco comments:    stopped amoking 35 years ago  Vaping Use   Vaping status: Never Used  Substance and Sexual Activity   Alcohol use: No   Drug use: No   Sexual activity: Yes  Other Topics Concern   Not on file  Social History Narrative   Marital Status: Married 1984 LIVES WITH WIFE   Children: 2 DAUGHTERS OUT OF HOME   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   Social Drivers of Health   Financial Resource Strain: Low Risk  (06/17/2023)   Overall Financial Resource Strain (CARDIA)    Difficulty of Paying Living Expenses: Not hard at all  Food Insecurity: No Food Insecurity (06/17/2023)   Hunger Vital Sign    Worried About Running Out of Food in the Last Year: Never true    Ran Out of Food in the Last Year: Never true  Transportation Needs: No Transportation Needs (06/17/2023)   PRAPARE - Administrator, Civil Service (Medical): No    Lack of Transportation (Non-Medical): No  Physical Activity: Insufficiently Active (06/17/2023)   Exercise Vital Sign    Days of Exercise per Week: 3 days    Minutes of Exercise per Session: 30 min  Stress: No Stress Concern Present (06/17/2023)   [employer] of Occupational Health - Occupational Stress Questionnaire    Feeling of Stress : Not at all  Social Connections: Socially Integrated (06/17/2023)   Social Connection and Isolation Panel [NHANES]    Frequency of Communication with Friends and Family: More than three times a week    Frequency of Social Gatherings with Friends and Family: Three times a week    Attends Religious Services: More than 4 times per year    Active Member of Clubs or Organizations: Yes    Attends Banker Meetings: Never    Marital Status: Married    Tobacco Counseling Counseling given: Not Answered Tobacco comments: stopped amoking 35 years  ago   Clinical Intake:  Pre-visit preparation completed: Yes  Pain : No/denies pain     Diabetes: No  How often do you need to have someone help you when you read instructions, pamphlets, or other written materials from your doctor or pharmacy?: 1 - Never  Interpreter Needed?: No  Information entered by :: Remi Haggard LPN   Activities of Daily Living    06/17/2023   12:22 PM  In your present  state of health, do you have any difficulty performing the following activities:  Hearing? 1  Vision? 0  Walking or climbing stairs? 0  Dressing or bathing? 0  Doing errands, shopping? 0  Preparing Food and eating ? N  Using the Toilet? N  In the past six months, have you accidently leaked urine? N  Do you have problems with loss of bowel control? N  Managing your Medications? N  Managing your Finances? N  Housekeeping or managing your Housekeeping? N    Patient Care Team: Donita Brooks, MD as PCP - General (Family Medicine)  Indicate any recent Medical Services you may have received from other than [facility] providers in the past year (date may be approximate).     Assessment:   This is a routine wellness examination for Romone.  Hearing/Vision screen Hearing Screening - Comments:: Does not wear hearing aids Some trouble hearing  Vision Screening - Comments:: Up to date [facility]   Goals Addressed             This Visit's Progress    Patient Stated       Would like to get a vacation home       Depression Screen    06/17/2023   12:26 PM 05/17/2023   10:52 AM 06/23/2022    8:18 AM 06/11/2022    9:41 AM 06/05/2021    8:21 AM 08/26/2020    2:34 PM 11/30/2017   10:49 AM  PHQ 2/9 Scores  PHQ - 2 Score 0 0 0 0 0 0 0  PHQ- 9 Score 0          Fall Risk    06/17/2023   12:20 PM 05/17/2023   10:52 AM 06/23/2022    8:18 AM 06/11/2022    9:41 AM 06/05/2021    8:32 AM  Fall Risk   Falls in the past year? 0 0 0 0 1  Number falls in past yr: 0 0 0 0 0  Injury with Fall? 0 0 0  0 0  Risk for fall due to :  No Fall Risks No Fall Risks No Fall Risks History of fall(s)  Follow up Falls evaluation completed;Education provided;Falls prevention discussed Falls prevention discussed;Falls evaluation completed Falls prevention discussed Falls prevention discussed;Education provided;Falls evaluation completed Falls prevention discussed    MEDICARE RISK AT HOME: Medicare Risk at Home Any stairs in or around the home?: No If so, are there any without handrails?: No Home free of loose throw rugs in walkways, pet beds, electrical cords, etc?: Yes Adequate lighting in your home to reduce risk of falls?: Yes Life alert?: No Use of a cane, walker or w/c?: No Grab bars in the bathroom?: No Shower chair or bench in shower?: Yes Elevated toilet seat or a handicapped toilet?: Yes  TIMED UP AND GO:  Was the test performed?  No    Cognitive Function:        06/17/2023   12:28 PM 06/11/2022    9:43 AM 06/05/2021    8:35 AM  6CIT Screen  What Year? 0 points 0 points 0 points  What month? 0 points 0 points 0 points  What time? 0 points 0 points 0 points  Count back from 20 0 points 0 points 0 points  Months in reverse 0 points 0 points 0 points  Repeat phrase 0 points 0 points 0 points  Total Score 0 points 0 points 0 points    Immunizations Immunization History  Administered Date(s) Administered  Fluad Quad(high Dose 65+) 02/16/2019, 02/12/2022   Fluad Trivalent(High Dose 65+) 02/15/2023   Influenza, High Dose Seasonal PF 01/07/2018   Influenza-Unspecified 02/18/2021   PFIZER(Purple Top)SARS-COV-2 Vaccination 11/17/2019, 12/08/2019   Pfizer Covid-19 Vaccine Bivalent Booster 82yrs & up 05/10/2020   Pneumococcal Conjugate-13 09/01/2016   Pneumococcal Polysaccharide-23 09/03/2015   Td 04/06/2001   Tdap 10/23/2011, 07/02/2018   Zoster Recombinant(Shingrix) 06/17/2022   Zoster, Live 04/06/2013    TDAP status: Up to date  Flu Vaccine status: Up to  date  Pneumococcal vaccine status: Up to date  Covid-19 vaccine status: Information provided on how to obtain vaccines.   Qualifies for Shingles Vaccine? Yes   Zostavax completed Yes   Shingrix Completed?: No.    Education has been provided regarding the importance of this vaccine. Patient has been advised to call insurance company to determine out of pocket expense if they have not yet received this vaccine. Advised may also receive vaccine at local pharmacy or Health Dept. Verbalized acceptance and understanding.  Screening Tests Health Maintenance  Topic Date Due   Zoster Vaccines- Shingrix (2 of 2) 08/12/2022   COVID-19 Vaccine (4 - 2024-25 season) 12/06/2022   Medicare Annual Wellness (AWV)  06/16/2024   DTaP/Tdap/Td (4 - Td or Tdap) 07/01/2028   Colonoscopy  07/06/2030   Pneumonia Vaccine 16+ Years old  Completed   INFLUENZA VACCINE  Completed   Hepatitis C Screening  Completed   HPV VACCINES  Aged Out    Health Maintenance  Health Maintenance Due  Topic Date Due   Zoster Vaccines- Shingrix (2 of 2) 08/12/2022   COVID-19 Vaccine (4 - 2024-25 season) 12/06/2022    Colorectal cancer screening: No longer required.   Lung Cancer Screening: (Low Dose CT Chest recommended if Age 88-80 years, 20 pack-year currently smoking OR have quit w/in 15years.) does not qualify.   Lung Cancer Screening Referral:   Additional Screening:  Hepatitis C Screening: does not qualify; Completed  2016  Vision Screening: Recommended annual ophthalmology exams for early detection of glaucoma and other disorders of the eye. Is the patient up to date with their annual eye exam?  Yes  Who is the provider or what is the name of the office in which the patient attends annual eye exams? [facility] If pt is not established with a provider, would they like to be referred to a provider to establish care? No .   Dental Screening: Recommended annual dental exams for proper oral hygiene    Community  Resource Referral / Chronic Care Management: CRR required this visit?  No   CCM required this visit?  No     Plan:     I have personally reviewed and noted the following in the patient's chart:   Medical and social history Use of alcohol, tobacco or illicit drugs  Current medications and supplements including opioid prescriptions. Patient is not currently taking opioid prescriptions. Functional ability and status Nutritional status Physical activity Advanced directives List of other physicians Hospitalizations, surgeries, and ER visits in previous 12 months Vitals Screenings to include cognitive, depression, and falls Referrals and appointments  In addition, I have reviewed and discussed with patient certain preventive protocols, quality metrics, and best practice recommendations. A written personalized care plan for preventive services as well as general preventive health recommendations were provided to patient.     Remi Haggard, LPN   07/13/8117   After Visit Summary: (MyChart) Due to this being a telephonic visit, the after visit summary with patients personalized  plan was offered to patient via MyChart   Nurse Notes:

## 2023-07-01 ENCOUNTER — Encounter: Payer: Self-pay | Admitting: Plastic Surgery

## 2023-07-01 ENCOUNTER — Ambulatory Visit: Admitting: Plastic Surgery

## 2023-07-01 VITALS — BP 145/88 | HR 71

## 2023-07-01 DIAGNOSIS — S81801A Unspecified open wound, right lower leg, initial encounter: Secondary | ICD-10-CM | POA: Diagnosis not present
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
## 2023-07-16 ENCOUNTER — Encounter: Payer: Self-pay | Admitting: Emergency Medicine

## 2023-07-16 ENCOUNTER — Ambulatory Visit
Admission: EM | Admit: 2023-07-16 | Discharge: 2023-07-16 | Disposition: A | Discharge status: Home / Self Care | Attending: Nurse Practitioner | Admitting: Nurse Practitioner

## 2023-07-16 DIAGNOSIS — U071 COVID-19: Secondary | ICD-10-CM

## 2023-07-16 LAB — POC COVID19/FLU A&B COMBO
Covid Antigen, POC: POSITIVE — AB
Influenza A Antigen, POC: NEGATIVE
Influenza B Antigen, POC: NEGATIVE

## 2023-07-16 MED ORDER — BENZONATATE 100 MG PO CAPS: 100.0000 mg | ORAL_CAPSULE | Freq: Three times a day (TID) | ORAL | 0 refills | Status: AC | PRN

## 2023-07-16 MED ORDER — PAXLOVID (300/100) 20 X 150 MG & 10 X 100MG PO TBPK: 3.0000 | ORAL_TABLET | Freq: Two times a day (BID) | ORAL | 0 refills | Status: AC

## 2023-07-16 NOTE — Discharge Instructions (Addendum)
 You tested positive for COVID-19.  Take the Paxlovid as prescribed to treat it.  Symptoms should improve over the next week to 10 days.  If you develop chest pain or shortness of breath, go to the emergency room.  Stop Lipitor until 07/25/2023.  Some things that can make you feel better are: - Increased rest - Increasing fluid with water/sugar free electrolytes - Acetaminophen and ibuprofen as needed for fever/pain - Salt water gargling, chloraseptic spray and throat lozenges for sore throat - OTC guaifenesin (Mucinex) 600 mg twice daily for congestion - Saline sinus flushes or a neti pot - Humidifying the air -Tessalon Perles every 8 hours as needed for dry cough

## 2023-07-16 NOTE — ED Triage Notes (Signed)
 Chills, fever and congestion that started this morning.

## 2023-07-16 NOTE — ED Provider Notes (Signed)
 [facility]    CSN: [id number] Arrival date & time: 07/16/23  1310      History   Chief Complaint No chief complaint on file.   HPI Timothy Jimenez is a 73 y.o. male.   Patient presents today with 1 day history of low-grade fever, Tmax 100 F, chills, congested cough, stuffy nose, sore throat, headache, 1 episode of vomiting, loose stool, decreased appetite, and fatigue.  No shortness of breath or chest pain, runny nose, abdominal pain.  Reports wife has been sick with similar symptoms since Monday but is now feeling better.  Has not taken anything for symptoms so far.    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 Ewing Schlein)    ESOPHAGOGASTRODUODENOSCOPY     medium HH esophagitis ( Dr  Ewing Schlein) 02/25/2009   TONSILLECTOMY     20 yoa       Home Medications    Prior to Admission medications   Medication Sig  Start Date End Date Taking? Authorizing Provider  benzonatate (TESSALON) 100 MG capsule Take 1 capsule (100 mg total) by mouth 3 (three) times daily as needed for cough. Do not take with alcohol or while operating or driving heavy machinery 07/14/79  Yes Cathlean Marseilles A, NP  nirmatrelvir/ritonavir (PAXLOVID, 300/100,) 20 x 150 MG & 10 x 100MG  TBPK Take 3 tablets by mouth 2 (two) times daily for 5 days. Patient GFR is 73.  Take nirmatrelvir (150 mg) two tablets twice daily for 5 days and ritonavir (100 mg) one tablet twice daily for 5 days. 07/16/23 07/21/23 Yes Valentino Nose, NP  atorvastatin (LIPITOR) 20 MG tablet Take 1 tablet (20 mg total) by mouth daily. 06/04/23   Donita Brooks, MD  levothyroxine (SYNTHROID) 150 MCG tablet Take 1 tablet (150 mcg total) by mouth daily. 06/02/23   Donita Brooks, MD  pantoprazole (PROTONIX) 40 MG tablet Take 1 tablet (40 mg total) by mouth daily. 05/17/23   Donita Brooks, MD  silver sulfADIAZINE (SILVADENE) 1 % cream Apply 1 Application topically daily. 06/04/23   Donita Brooks, MD    Family History Family History  Problem Relation Age of Onset   Thyroid disease Mother    Heart failure Mother    Hypertension Mother    Stroke Mother    Kidney  disease Father    Hypertension Father    Kidney failure Father    Heart attack Father    COPD Sister    Rheumatic fever Brother        age 84 h/o w heart complications   Hypertension Brother    Stroke Brother    Heart disease Brother    Liver disease Paternal Aunt    Heart disease Other        many cousins deceased MI x2   Hypertension Other        mothers side   Diabetes Other        mothers side   Lung cancer Other        paternal/maternal grandparents   Prostate cancer Neg Hx    Colon cancer Neg Hx     Social History Social History   Tobacco Use   Smoking status: Former    Current packs/day: 0.00    Average packs/day: 0.5 packs/day for 3.0 years (1.5 ttl pk-yrs)    Types:  Cigarettes    Start date: 41    Quit date: 22    Years since quitting: 48.3   Smokeless tobacco: Never   Tobacco comments:    stopped amoking 35 years ago  Vaping Use   Vaping status: Never Used  Substance Use Topics   Alcohol use: No   Drug use: No     Allergies   Polycillin [ampicillin]   Review of Systems Review of Systems Per HPI  Physical Exam Triage Vital Signs ED Triage Vitals  Encounter Vitals Group     BP 07/16/23 1327 (!) 158/72     Systolic BP Percentile --      Diastolic BP Percentile --      Pulse Rate 07/16/23 1327 84     Resp 07/16/23 1327 18     Temp 07/16/23 1327 100 F (37.8 C)     Temp Source 07/16/23 1327 Oral     SpO2 07/16/23 1327 93 %     Weight --      Height --      Head Circumference --      Peak Flow --      Pain Score 07/16/23 1328 0     Pain Loc --      Pain Education --      Exclude from Growth Chart --    No data found.  Updated Vital Signs BP (!) 158/72 (BP Location: Right Arm)   Pulse 84   Temp 100 F (37.8 C) (Oral)   Resp 18   SpO2 93%   Visual Acuity Right Eye Distance:   Left Eye Distance:   Bilateral Distance:    Right Eye Near:   Left Eye Near:    Bilateral Near:     Physical Exam Vitals and nursing note reviewed.  Constitutional:      General: He is not in acute distress.    Appearance: Normal appearance. He is not ill-appearing or toxic-appearing.  HENT:     Head: Normocephalic and atraumatic.     Right Ear: Tympanic membrane, ear canal and external ear normal.     Left Ear: Tympanic membrane, ear canal and external ear normal.     Nose: Congestion present. No rhinorrhea.     Mouth/Throat:     Mouth: Mucous membranes are moist.     Pharynx: Oropharynx is clear. No oropharyngeal exudate or posterior oropharyngeal erythema.  Eyes:     General: No scleral icterus.  Extraocular Movements: Extraocular movements intact.  Cardiovascular:     Rate and Rhythm: Normal rate and regular rhythm.   Pulmonary:     Effort: Pulmonary effort is normal. No respiratory distress.     Breath sounds: Normal breath sounds. No wheezing, rhonchi or rales.  Musculoskeletal:     Cervical back: Normal range of motion and neck supple.  Lymphadenopathy:     Cervical: No cervical adenopathy.  Skin:    General: Skin is warm and dry.     Coloration: Skin is not jaundiced or pale.     Findings: No erythema or rash.  Neurological:     Mental Status: He is alert and oriented to person, place, and time.  Psychiatric:        Behavior: Behavior is cooperative.      UC Treatments / Results  Labs (all labs ordered are listed, but only abnormal results are displayed) Labs Reviewed  POC COVID19/FLU A&B COMBO - Abnormal; Notable for the following components:      Result Value   Covid Antigen, POC Positive (*)    All other components within normal limits    EKG   Radiology No results found.  Procedures Procedures (including critical care time)  Medications Ordered in UC Medications - No data to display  Initial Impression / Assessment and Plan / UC Course  I have reviewed the triage vital signs and the nursing notes.  Pertinent labs & imaging results that were available during my care of the patient were reviewed by me and considered in my medical decision making (see chart for details).   Patient is mildly hypertensive in triage, otherwise vital signs are stable.  1. COVID-19 Vitals and exam are reassuring today Start Paxlovid, recent GFR normal Other supportive care discussed including cough suppressant medicine, over-the-counter Coricidin, guaifenesin Return and ER precautions discussed with patient and wife  The patient was given the opportunity to ask questions.  All questions answered to their satisfaction.  The patient is in agreement to this plan.   Final Clinical Impressions(s) / UC Diagnoses   Final diagnoses:  COVID-19     Discharge Instructions      You tested  positive for COVID-19.  Take the Paxlovid as prescribed to treat it.  Symptoms should improve over the next week to 10 days.  If you develop chest pain or shortness of breath, go to the emergency room.  Stop Lipitor until 07/25/2023.  Some things that can make you feel better are: - Increased rest - Increasing fluid with water/sugar free electrolytes - Acetaminophen and ibuprofen as needed for fever/pain - Salt water gargling, chloraseptic spray and throat lozenges for sore throat - OTC guaifenesin (Mucinex) 600 mg twice daily for congestion - Saline sinus flushes or a neti pot - Humidifying the air -Tessalon Perles every 8 hours as needed for dry cough      ED Prescriptions     Medication Sig Dispense Auth. Provider   nirmatrelvir/ritonavir (PAXLOVID, 300/100,) 20 x 150 MG & 10 x 100MG  TBPK Take 3 tablets by mouth 2 (two) times daily for 5 days. Patient GFR is 73.  Take nirmatrelvir (150 mg) two tablets twice daily for 5 days and ritonavir (100 mg) one tablet twice daily for 5 days. 30 tablet Cathlean Marseilles A, NP   benzonatate (TESSALON) 100 MG capsule Take 1 capsule (100 mg total) by mouth 3 (three) times daily as needed for cough. Do not take with alcohol or while operating or driving heavy machinery  21 capsule Valentino Nose, NP      PDMP not reviewed this encounter.   Valentino Nose, NP 07/16/23 [id number]

## 2023-07-21 NOTE — Telephone Encounter (Signed)
error 

## 2023-09-21 LAB — T4, FREE: Free T4: 1.93 ng/dL — ABNORMAL HIGH (ref 0.82–1.77)

## 2023-09-21 LAB — TSH: TSH: 0.072 u[IU]/mL — ABNORMAL LOW (ref 0.450–4.500)

## 2023-09-22 NOTE — Patient Instructions (Signed)

## 2023-09-23 ENCOUNTER — Ambulatory Visit (INDEPENDENT_AMBULATORY_CARE_PROVIDER_SITE_OTHER): Admitting: Nurse Practitioner

## 2023-09-23 ENCOUNTER — Encounter: Payer: Self-pay | Admitting: Nurse Practitioner

## 2023-09-23 VITALS — BP 130/80 | HR 66 | Ht 75.0 in | Wt 249.6 lb

## 2023-09-23 DIAGNOSIS — E890 Postprocedural hypothyroidism: Secondary | ICD-10-CM

## 2023-09-23 DIAGNOSIS — E039 Hypothyroidism, unspecified: Secondary | ICD-10-CM

## 2023-09-23 MED ORDER — LEVOTHYROXINE SODIUM 150 MCG PO TABS: 150.0000 ug | ORAL_TABLET | Freq: Every day | ORAL | 1 refills | Status: DC

## 2023-09-23 NOTE — Progress Notes (Signed)
 09/23/2023     Endocrinology Follow Up Note    Subjective:    Patient ID: Timothy Jimenez, male    DOB: [birth year], PCP Timothy Lefort, MD.   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 Lavaughn Jimenez)    ESOPHAGOGASTRODUODENOSCOPY     medium HH esophagitis ( Dr  Timothy Jimenez) 02/25/2009   TONSILLECTOMY     20 yoa    Social History   Socioeconomic History   Marital status: Married    Spouse name: Timothy Jimenez   Number of children: 2   Years of education: Not on file   Highest education level: Not on file  Occupational History   Not on file  Tobacco Use   Smoking status: Former    Current packs/day: 0.00    Average packs/day: 0.5 packs/day for 3.0 years (1.5 ttl pk-yrs)    Types: Cigarettes    Start date: 17    Quit date: 82    Years since quitting: 48.4   Smokeless tobacco: Never   Tobacco comments:    stopped amoking 35 years ago  Vaping Use   Vaping status: Never Used  Substance and Sexual Activity   Alcohol use: No   Drug use: No   Sexual activity: Yes  Other Topics Concern   Not on file  Social History Narrative   Marital Status: Married 1984 LIVES WITH WIFE   Children: 2 DAUGHTERS OUT OF HOME   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   Social Drivers of Health   Financial Resource Strain: Low Risk  (06/17/2023)   Overall Financial Resource Strain (CARDIA)    Difficulty of  Paying Living Expenses: Not hard at all  Food Insecurity: No Food Insecurity (06/17/2023)   Hunger Vital Sign    Worried About Running Out of Food in the Last Year: Never true    Ran Out of Food in the Last Year: Never true  Transportation Needs: No Transportation Needs (06/17/2023)   PRAPARE - Administrator, Civil Service (Medical): No    Lack of Transportation (Non-Medical): No  Physical Activity: Insufficiently Active (06/17/2023)   Exercise Vital Sign    Days of Exercise per Week: 3 days    Minutes of Exercise per Session: 30 min  Stress: No Stress Concern Present (06/17/2023)   [employer] of Occupational Health - Occupational Stress Questionnaire    Feeling of Stress : Not at all  Social Connections: Socially Integrated (06/17/2023)   Social Connection and Isolation Panel    Frequency of Communication with Friends and Family: More than three times a week    Frequency of Social Gatherings with Friends and Family: Three times a week    Attends Religious Services: More than 4 times per year    Active Member of Clubs  or Organizations: Yes    Attends Banker Meetings: Never    Marital Status: Married    Family History  Problem Relation Age of Onset   Thyroid  disease Mother    Heart failure Mother    Hypertension Mother    Stroke Mother    Kidney disease Father    Hypertension Father    Kidney failure Father    Heart attack Father    COPD Sister    Rheumatic fever Brother        age 74 h/o w heart complications   Hypertension Brother    Stroke Brother    Heart disease Brother    Liver disease Paternal Aunt    Heart disease Other        many cousins deceased MI x2   Hypertension Other        mothers side   Diabetes Other        mothers side   Lung cancer Other        paternal/maternal grandparents   Prostate cancer Neg Hx    Colon cancer Neg Hx     Outpatient Encounter Medications as of 09/23/2023  Medication Sig   atorvastatin   (LIPITOR) 20 MG tablet Take 1 tablet (20 mg total) by mouth daily.   benzonatate  (TESSALON ) 100 MG capsule Take 1 capsule (100 mg total) by mouth 3 (three) times daily as needed for cough. Do not take with alcohol or while operating or driving heavy machinery   pantoprazole  (PROTONIX ) 40 MG tablet Take 1 tablet (40 mg total) by mouth daily.   silver  sulfADIAZINE  (SILVADENE ) 1 % cream Apply 1 Application topically daily.   [DISCONTINUED] levothyroxine  (SYNTHROID ) 150 MCG tablet Take 1 tablet (150 mcg total) by mouth daily.   levothyroxine  (SYNTHROID ) 150 MCG tablet Take 1 tablet (150 mcg total) by mouth daily before breakfast.   No facility-administered encounter medications on file as of 09/23/2023.    ALLERGIES: Allergies  Allergen Reactions   Polycillin [Ampicillin]     Hives     VACCINATION STATUS: Immunization History  Administered Date(s) Administered   Fluad Quad(high Dose 65+) 02/16/2019, 02/12/2022   Fluad Trivalent(High Dose 65+) 02/15/2023   Influenza, High Dose Seasonal PF 01/07/2018   Influenza-Unspecified 02/18/2021   PFIZER(Purple Top)SARS-COV-2 Vaccination 11/17/2019, 12/08/2019   Pfizer Covid-19 Vaccine Bivalent Booster 71yrs & up 05/10/2020   Pneumococcal Conjugate-13 09/01/2016   Pneumococcal Polysaccharide-23 09/03/2015   Td 04/06/2001   Tdap 10/23/2011, 07/02/2018   Zoster Recombinant(Shingrix) 06/17/2022   Zoster, Live 04/06/2013     HPI  Timothy Jimenez is 73 y.o. male who presents today with a medical history as above. he is being seen in follow up after being seen in consultation for hyperthyroidism requested by Timothy Lefort, MD.  He was diagnosed years ago with under-active thyroid  and had been on thyroid  hormone replacement up until about 12 weeks ago where he was taken off due to undetectable TSH. he has been dealing with symptoms of heat intolerance, fatigue, unintentional weight loss, anxiety, palpitation, diarrhea, and worsening hoarseness for  ?the last 6 months but his wife noticed this change starting around 1 year ago. These symptoms are progressively worsening and troubling to him.   He had positive TPO antibodies at 89, indicating autoimmune thyroid  dysfunction.  he denies dysphagia, choking, shortness of breath, but wife has noticed some recent voice change.    he does have family history of thyroid  dysfunction in his mother, but denies family hx of thyroid   cancer. he denies personal history of goiter. he is not currently on any anti-thyroid  medications nor on any thyroid  hormone supplements. Denies use of Biotin containing supplements.  He had RAI for Graves disease on 07/23/21.  Review of systems  Constitutional: + decreasing body weight,  current Body mass index is 31.2 kg/m. , + fatigue- somewhat improved, no subjective hyperthermia, no subjective hypothermia Eyes: no blurry vision, no xerophthalmia ENT: no sore throat, no nodules palpated in throat, no dysphagia/odynophagia, no hoarseness Cardiovascular: no chest pain, no shortness of breath, + intermittent palpitations, no leg swelling Respiratory: no cough, no shortness of breath Gastrointestinal: no nausea/vomiting/diarrhea Musculoskeletal: + diffuse muscle/joint aches-stable Skin: no rashes, no hyperemia Neurological: no tremors, no numbness, no tingling, no dizziness Psychiatric: no depression, no anxiety   Objective:    BP 130/80 (BP Location: Right Arm, Patient Position: Sitting, Cuff Size: Large)   Pulse 66   Ht 6' 3 (1.905 m)   Wt 249 lb 9.6 oz (113.2 kg)   BMI 31.20 kg/m   Wt Readings from Last 3 Encounters:  09/23/23 249 lb 9.6 oz (113.2 kg)  06/16/23 257 lb 12.8 oz (116.9 kg)  06/04/23 260 lb (117.9 kg)     BP Readings from Last 3 Encounters:  09/23/23 130/80  07/16/23 (!) 158/72  07/01/23 (!) 145/88                         Physical Exam- Limited  Constitutional:  Body mass index is 31.2 kg/m. , not in acute distress, normal state of  mind Eyes:  EOMI, no exophthalmos Cardiovascular: RRR, no murmurs, rubs, or gallops, no edema Musculoskeletal: no gross deformities, strength intact in all four extremities, no gross restriction of joint movements Skin:  no rashes, no hyperemia Neurological: no tremor with outstretched hands   CMP     Component Value Date/Time   NA 140 05/17/2023 1106   K 4.3 05/17/2023 1106   CL 105 05/17/2023 1106   CO2 27 05/17/2023 1106   GLUCOSE 102 (H) 05/17/2023 1106   BUN 15 05/17/2023 1106   BUN 17 06/29/2022 0000   CREATININE 1.08 05/17/2023 1106   CALCIUM  9.2 05/17/2023 1106   PROT 6.8 05/17/2023 1106   ALBUMIN 4.0 06/29/2022 0000   AST 16 05/17/2023 1106   ALT 17 05/17/2023 1106   ALKPHOS 91 06/29/2022 0000   BILITOT 0.4 05/17/2023 1106   GFRNONAA 62 08/26/2020 1452   GFRAA 71 08/26/2020 1452     CBC    Component Value Date/Time   WBC 5.7 05/17/2023 1106   RBC 4.55 05/17/2023 1106   HGB 13.9 05/17/2023 1106   HCT 40.8 05/17/2023 1106   PLT 211 05/17/2023 1106   MCV 89.7 05/17/2023 1106   MCH 30.5 05/17/2023 1106   MCHC 34.1 05/17/2023 1106   RDW 13.0 05/17/2023 1106   LYMPHSABS 1,108 06/19/2022 0805   MONOABS 574 08/07/2016 0858   EOSABS 171 05/17/2023 1106   BASOSABS 40 05/17/2023 1106     Diabetic Labs (most recent): Lab Results  Component Value Date   HGBA1C 5.7 06/18/2011   MICROALBUR 1.6 08/05/2009    Lipid Panel     Component Value Date/Time   CHOL 158 05/17/2023 1106   TRIG 107 05/17/2023 1106   HDL 43 05/17/2023 1106   CHOLHDL 3.7 05/17/2023 1106   VLDL 11 08/07/2016 0858   LDLCALC 95 05/17/2023 1106     Lab Results  Component Value Date  TSH 0.072 (L) 09/20/2023   TSH 0.058 (L) 06/09/2023   TSH 0.06 (L) 05/17/2023   TSH 0.376 (L) 02/08/2023   TSH 0.460 10/12/2022   TSH 6.29 (A) 06/24/2022   TSH 6.29 (H) 06/19/2022   TSH 14.100 (H) 04/30/2022   TSH 15.100 (H) 03/02/2022   TSH 23.800 (H) 12/29/2021   FREET4 1.93 (H) 09/20/2023    FREET4 1.76 06/09/2023   FREET4 1.76 02/08/2023   FREET4 1.61 10/12/2022   FREET4 1.2 06/19/2022   FREET4 1.28 04/30/2022   FREET4 1.21 03/02/2022   FREET4 1.10 12/29/2021   FREET4 0.57 (L) 11/03/2021   FREET4 0.68 (L) 08/26/2021    Uptake and Scan 06/26/21  CLINICAL DATA:  Hypothyroidism, cessation of levothyroxine  therapy 12 weeks ago   EXAM: THYROID  SCAN AND UPTAKE - 4 AND 24 HOURS   TECHNIQUE: Following oral administration of I-123 capsule, anterior planar imaging was acquired at 24 hours. Thyroid  uptake was calculated with a thyroid  probe at 4-6 hours and 24 hours.   RADIOPHARMACEUTICALS:  Four under 24 uCi I-123 sodium iodide p.o.   COMPARISON:  No relevant prior studies available for comparison at this institution   FINDINGS: Planar imaging the thyroid  demonstrates normal homogeneous symmetrical uptake within the thyroid . No focal lesions identified.   4 hour I-123 uptake = 20.3% (normal 5-20%)   24 hour I-123 uptake = 45.7% (normal 10-30%)   IMPRESSION: 1. Elevated iodine  uptake values at 4 hours and 24 hours. 2. No focal parenchymal abnormalities identified on imaging.     Electronically Signed   By: Bobbye Burrow M.D.   On: 06/26/2021 17:26   Latest Reference Range & Units 06/24/22 00:00 10/12/22 12:03 02/08/23 10:49 05/17/23 11:06 06/09/23 14:59 09/20/23 08:33  TSH 0.450 - 4.500 uIU/mL 6.29 ! (E) 0.460 0.376 (L) 0.06 (L) 0.058 (L) 0.072 (L)  T4,Free(Direct) 0.82 - 1.77 ng/dL  1.61 0.96  0.45 4.09 (H)  !: Data is abnormal (L): Data is abnormally low (H): Data is abnormally high (E): External lab result  Assessment & Plan:   1. Hypothyroidism-s/p RAI for Graves Disease  he is being seen at a kind request of Timothy Lefort, MD.  He had RAI for Graves disease on 07/23/21.  -His previsit TFTs are consistent with slight over-replacement yet again.  He is advised to continue his Levothyroxine  150 mcg po daily before breakfast but to skip 1 day per  week.  Will recheck prior to next visit and adjust dose accordingly.  Historically the 137 mcg dose was not enough.   - The correct intake of thyroid  hormone (Levothyroxine , Synthroid ), is on empty stomach first thing in the morning, with water, separated by at least 30 minutes from breakfast and other medications,  and separated by more than 4 hours from calcium , iron, multivitamins, acid reflux medications (PPIs).  - This medication is a life-long medication and will be needed to correct thyroid  hormone imbalances for the rest of your life.  The dose may change from time to time, based on thyroid  blood work.  - It is extremely important to be consistent taking this medication, near the same time each morning.  -AVOID TAKING PRODUCTS CONTAINING BIOTIN (commonly found in Hair, Skin, Nails vitamins) AS IT INTERFERES WITH THE VALIDITY OF THYROID  FUNCTION BLOOD TESTS.      -Patient is advised to maintain close follow up with Timothy Lefort, MD for primary care needs.    I spent  27  minutes in the care of the patient  today including review of labs from Thyroid  Function, CMP, and other relevant labs ; imaging/biopsy records (current and previous including abstractions from other facilities); face-to-face time discussing  his lab results and symptoms, medications doses, his options of short and long term treatment based on the latest standards of care / guidelines;   and documenting the encounter.  Timothy Jimenez  participated in the discussions, expressed understanding, and voiced agreement with the above plans.  All questions were answered to his satisfaction. he is encouraged to contact clinic should he have any questions or concerns prior to his return visit.   Follow up plan: Return in about 3 months (around 12/24/2023) for Thyroid  follow up, Previsit labs.   Thank you for involving me in the care of this pleasant patient, and I will continue to update you with his progress.  Hulon Magic, [facility] [address] [place], [place] [address] Phone: [phone] Fax: [phone]  09/23/2023, 11:44 AM

## 2023-12-15 LAB — TSH: TSH: 0.183 u[IU]/mL — ABNORMAL LOW (ref 0.450–4.500)

## 2023-12-15 LAB — T4, FREE: Free T4: 1.72 ng/dL (ref 0.82–1.77)

## 2023-12-23 NOTE — Patient Instructions (Signed)

## 2023-12-24 ENCOUNTER — Encounter: Payer: Self-pay | Admitting: Nurse Practitioner

## 2023-12-24 ENCOUNTER — Ambulatory Visit (INDEPENDENT_AMBULATORY_CARE_PROVIDER_SITE_OTHER): Admitting: Nurse Practitioner

## 2023-12-24 VITALS — BP 122/64 | HR 70 | Ht 75.0 in | Wt 251.6 lb

## 2023-12-24 DIAGNOSIS — E890 Postprocedural hypothyroidism: Secondary | ICD-10-CM | POA: Diagnosis not present

## 2023-12-24 DIAGNOSIS — E039 Hypothyroidism, unspecified: Secondary | ICD-10-CM

## 2023-12-24 MED ORDER — LEVOTHYROXINE SODIUM 150 MCG PO TABS: 150.0000 ug | ORAL_TABLET | Freq: Every day | ORAL | 1 refills | Status: DC

## 2023-12-24 NOTE — Progress Notes (Signed)
 12/24/2023     Endocrinology Follow Up Note    Subjective:    Patient ID: Timothy Jimenez, male    DOB: Aug 08, 1950, PCP Timothy Butler DASEN, MD.   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 Rosalie)    ESOPHAGOGASTRODUODENOSCOPY     medium HH esophagitis ( Dr  Timothy Jimenez) 02/25/2009   TONSILLECTOMY     20 yoa    Social History   Socioeconomic History   Marital status: Married    Spouse name: Timothy Jimenez   Number of children: 2   Years of education: Not on file   Highest education level: Not on file  Occupational History   Not on file  Tobacco Use   Smoking status: Former    Current packs/day: 0.00    Average packs/day: 0.5 packs/day for 3.0 years (1.5 ttl pk-yrs)    Types: Cigarettes    Start date: 50    Quit date: 38    Years since quitting: 48.7   Smokeless tobacco: Never   Tobacco comments:    stopped amoking 35 years ago  Vaping Use   Vaping status: Never Used  Substance and Sexual Activity   Alcohol use: No   Drug use: No   Sexual activity: Yes  Other Topics Concern   Not on file  Social History Narrative   Marital Status: Married 1984 LIVES WITH WIFE   Children: 2 DAUGHTERS OUT OF HOME   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   Social Drivers of Health   Financial Resource Strain: Low Risk  (06/17/2023)   Overall Financial Resource Strain (CARDIA)    Difficulty of  Paying Living Expenses: Not hard at all  Food Insecurity: No Food Insecurity (06/17/2023)   Hunger Vital Sign    Worried About Running Out of Food in the Last Year: Never true    Ran Out of Food in the Last Year: Never true  Transportation Needs: No Transportation Needs (06/17/2023)   PRAPARE - Administrator, Civil Service (Medical): No    Lack of Transportation (Non-Medical): No  Physical Activity: Insufficiently Active (06/17/2023)   Exercise Vital Sign    Days of Exercise per Week: 3 days    Minutes of Exercise per Session: 30 min  Stress: No Stress Concern Present (06/17/2023)   Timothy Jimenez of Occupational Health - Occupational Stress Questionnaire    Feeling of Stress : Not at all  Social Connections: Socially Integrated (06/17/2023)   Social Connection and Isolation Panel    Frequency of Communication with Friends and Family: More than three times a week    Frequency of Social Gatherings with Friends and Family: Three times a week    Attends Religious Services: More than 4 times per year    Active Member of Clubs  or Organizations: Yes    Attends Banker Meetings: Never    Marital Status: Married    Family History  Problem Relation Age of Onset   Thyroid  disease Mother    Heart failure Mother    Hypertension Mother    Stroke Mother    Kidney disease Father    Hypertension Father    Kidney failure Father    Heart attack Father    COPD Sister    Rheumatic fever Brother        age 14 h/o w heart complications   Hypertension Brother    Stroke Brother    Heart disease Brother    Liver disease Paternal Aunt    Heart disease Other        many cousins deceased MI x2   Hypertension Other        mothers side   Diabetes Other        mothers side   Lung cancer Other        paternal/maternal grandparents   Prostate cancer Neg Hx    Colon cancer Neg Hx     Outpatient Encounter Medications as of 12/24/2023  Medication Sig   atorvastatin   (LIPITOR) 20 MG tablet Take 1 tablet (20 mg total) by mouth daily.   pantoprazole  (PROTONIX ) 40 MG tablet Take 1 tablet (40 mg total) by mouth daily.   silver  sulfADIAZINE  (SILVADENE ) 1 % cream Apply 1 Application topically daily.   [DISCONTINUED] levothyroxine  (SYNTHROID ) 150 MCG tablet Take 1 tablet (150 mcg total) by mouth daily before breakfast.   benzonatate  (TESSALON ) 100 MG capsule Take 1 capsule (100 mg total) by mouth 3 (three) times daily as needed for cough. Do not take with alcohol or while operating or driving heavy machinery (Patient not taking: Reported on 12/24/2023)   levothyroxine  (SYNTHROID ) 150 MCG tablet Take 1 tablet (150 mcg total) by mouth daily before breakfast.   No facility-administered encounter medications on file as of 12/24/2023.    ALLERGIES: Allergies  Allergen Reactions   Polycillin [Ampicillin]     Hives     VACCINATION STATUS: Immunization History  Administered Date(s) Administered   Fluad Quad(high Dose 65+) 02/16/2019, 02/12/2022   Fluad Trivalent(High Dose 65+) 02/15/2023   INFLUENZA, HIGH DOSE SEASONAL PF 01/07/2018   Influenza-Unspecified 02/18/2021   PFIZER(Purple Top)SARS-COV-2 Vaccination 11/17/2019, 12/08/2019   Pfizer Covid-19 Vaccine Bivalent Booster 43yrs & up 05/10/2020   Pneumococcal Conjugate-13 09/01/2016   Pneumococcal Polysaccharide-23 09/03/2015   Td 04/06/2001   Tdap 10/23/2011, 07/02/2018   Zoster Recombinant(Shingrix) 06/17/2022   Zoster, Live 04/06/2013     HPI  [name] is 73 y.o. male who presents today with a medical history as above. he is being seen in follow up after being seen in consultation for hyperthyroidism requested by Timothy Butler DASEN, MD.  He was diagnosed years ago with under-active thyroid  and had been on thyroid  hormone replacement up until about 12 weeks ago where he was taken off due to undetectable TSH. he has been dealing with symptoms of heat intolerance, fatigue, unintentional weight loss,  anxiety, palpitation, diarrhea, and worsening hoarseness for ?the last 6 months but his wife noticed this change starting around 1 year ago. These symptoms are progressively worsening and troubling to him.   He had positive TPO antibodies at 89, indicating autoimmune thyroid  dysfunction.  he denies dysphagia, choking, shortness of breath, but wife has noticed some recent voice change.    he does have family history of thyroid  dysfunction in  his mother, but denies family hx of thyroid  cancer. he denies personal history of goiter. he is not currently on any anti-thyroid  medications nor on any thyroid  hormone supplements. Denies use of Biotin containing supplements.  He had RAI for Graves disease on 07/23/21.  Review of systems  Constitutional: + stable body weight,  current Body mass index is 31.45 kg/m. , + fatigue- somewhat improved-notes some worsening on Mondays where he skips his Sunday thyroid  pill, no subjective hyperthermia, no subjective hypothermia Eyes: no blurry vision, no xerophthalmia ENT: no sore throat, no nodules palpated in throat, no dysphagia/odynophagia, no hoarseness Cardiovascular: no chest pain, no shortness of breath, + intermittent palpitations-greatly improved, no leg swelling Respiratory: no cough, no shortness of breath Gastrointestinal: no nausea/vomiting/diarrhea Musculoskeletal: + diffuse muscle/joint aches-stable Skin: no rashes, no hyperemia Neurological: no tremors, no numbness, no tingling, no dizziness Psychiatric: no depression, no anxiety   Objective:    BP 122/64 (BP Location: Left Arm, Patient Position: Sitting, Cuff Size: Large)   Pulse 70   Ht 6' 3 (1.905 m)   Wt 251 lb 9.6 oz (114.1 kg)   BMI 31.45 kg/m   Wt Readings from Last 3 Encounters:  12/24/23 251 lb 9.6 oz (114.1 kg)  09/23/23 249 lb 9.6 oz (113.2 kg)  06/16/23 257 lb 12.8 oz (116.9 kg)     BP Readings from Last 3 Encounters:  12/24/23 122/64  09/23/23 130/80  07/16/23 (!)  158/72                         Physical Exam- Limited  Constitutional:  Body mass index is 31.45 kg/m. , not in acute distress, normal state of mind Eyes:  EOMI, no exophthalmos Musculoskeletal: no gross deformities, strength intact in all four extremities, no gross restriction of joint movements Skin:  no rashes, no hyperemia Neurological: no tremor with outstretched hands   CMP     Component Value Date/Time   NA 140 05/17/2023 1106   K 4.3 05/17/2023 1106   CL 105 05/17/2023 1106   CO2 27 05/17/2023 1106   GLUCOSE 102 (H) 05/17/2023 1106   BUN 15 05/17/2023 1106   BUN 17 06/29/2022 0000   CREATININE 1.08 05/17/2023 1106   CALCIUM  9.2 05/17/2023 1106   PROT 6.8 05/17/2023 1106   ALBUMIN 4.0 06/29/2022 0000   AST 16 05/17/2023 1106   ALT 17 05/17/2023 1106   ALKPHOS 91 06/29/2022 0000   BILITOT 0.4 05/17/2023 1106   GFRNONAA 62 08/26/2020 1452   GFRAA 71 08/26/2020 1452     CBC    Component Value Date/Time   WBC 5.7 05/17/2023 1106   RBC 4.55 05/17/2023 1106   HGB 13.9 05/17/2023 1106   HCT 40.8 05/17/2023 1106   PLT 211 05/17/2023 1106   MCV 89.7 05/17/2023 1106   MCH 30.5 05/17/2023 1106   MCHC 34.1 05/17/2023 1106   RDW 13.0 05/17/2023 1106   LYMPHSABS 1,108 06/19/2022 0805   MONOABS 574 08/07/2016 0858   EOSABS 171 05/17/2023 1106   BASOSABS 40 05/17/2023 1106     Diabetic Labs (most recent): Lab Results  Component Value Date   HGBA1C 5.7 06/18/2011   MICROALBUR 1.6 08/05/2009    Lipid Panel     Component Value Date/Time   CHOL 158 05/17/2023 1106   TRIG 107 05/17/2023 1106   HDL 43 05/17/2023 1106   CHOLHDL 3.7 05/17/2023 1106   VLDL 11 08/07/2016 0858   LDLCALC 95 05/17/2023 1106  Lab Results  Component Value Date   TSH 0.183 (L) 12/14/2023   TSH 0.072 (L) 09/20/2023   TSH 0.058 (L) 06/09/2023   TSH 0.06 (L) 05/17/2023   TSH 0.376 (L) 02/08/2023   TSH 0.460 10/12/2022   TSH 6.29 (A) 06/24/2022   TSH 6.29 (H) 06/19/2022    TSH 14.100 (H) 04/30/2022   TSH 15.100 (H) 03/02/2022   FREET4 1.72 12/14/2023   FREET4 1.93 (H) 09/20/2023   FREET4 1.76 06/09/2023   FREET4 1.76 02/08/2023   FREET4 1.61 10/12/2022   FREET4 1.2 06/19/2022   FREET4 1.28 04/30/2022   FREET4 1.21 03/02/2022   FREET4 1.10 12/29/2021   FREET4 0.57 (L) 11/03/2021    Uptake and Scan 06/26/21  CLINICAL DATA:  Hypothyroidism, cessation of levothyroxine  therapy 12 weeks ago   EXAM: THYROID  SCAN AND UPTAKE - 4 AND 24 HOURS   TECHNIQUE: Following oral administration of I-123 capsule, anterior planar imaging was acquired at 24 hours. Thyroid  uptake was calculated with a thyroid  probe at 4-6 hours and 24 hours.   RADIOPHARMACEUTICALS:  Four under 24 uCi I-123 sodium iodide p.o.   COMPARISON:  No relevant prior studies available for comparison at this institution   FINDINGS: Planar imaging the thyroid  demonstrates normal homogeneous symmetrical uptake within the thyroid . No focal lesions identified.   4 hour I-123 uptake = 20.3% (normal 5-20%)   24 hour I-123 uptake = 45.7% (normal 10-30%)   IMPRESSION: 1. Elevated iodine  uptake values at 4 hours and 24 hours. 2. No focal parenchymal abnormalities identified on imaging.     Electronically Signed   By: Ozell Daring M.D.   On: 06/26/2021 17:26   Latest Reference Range & Units 05/17/23 11:06 06/09/23 14:59 09/20/23 08:33 12/14/23 09:36  TSH 0.450 - 4.500 uIU/mL 0.06 (L) 0.058 (L) 0.072 (L) 0.183 (L)  T4,Free(Direct) 0.82 - 1.77 ng/dL  8.23 8.06 (H) 8.27  (L): Data is abnormally low (H): Data is abnormally high  Assessment & Plan:   1. Hypothyroidism-s/p RAI for Graves Disease  he is being seen at a kind request of Timothy Butler DASEN, MD.  He had RAI for Graves disease on 07/23/21.  -His previsit TFTs are consistent with appropriate hormone replacement (TSH slightly low but improved and FT4 on upper end of normal).  He is advised to continue his Levothyroxine  150 mcg po  daily before breakfast skipping 1 day per week.   We also discussed taking half pill on Saturday and half on Sunday so he isnt going a full day without at least a little hormone.  I did agree to this experiment.   Will recheck prior to next visit and adjust dose accordingly.  Historically the 137 mcg dose was not enough.   - The correct intake of thyroid  hormone (Levothyroxine , Synthroid ), is on empty stomach first thing in the morning, with water, separated by at least 30 minutes from breakfast and other medications,  and separated by more than 4 hours from calcium , iron, multivitamins, acid reflux medications (PPIs).  - This medication is a life-long medication and will be needed to correct thyroid  hormone imbalances for the rest of your life.  The dose may change from time to time, based on thyroid  blood work.  - It is extremely important to be consistent taking this medication, near the same time each morning.  -AVOID TAKING PRODUCTS CONTAINING BIOTIN (commonly found in Hair, Skin, Nails vitamins) AS IT INTERFERES WITH THE VALIDITY OF THYROID  FUNCTION BLOOD TESTS.      -  Patient is advised to maintain close follow up with Timothy Butler DASEN, MD for primary care needs.    I spent  36  minutes in the care of the patient today including review of labs from Thyroid  Function, CMP, and other relevant labs ; imaging/biopsy records (current and previous including abstractions from other facilities); face-to-face time discussing  his lab results and symptoms, medications doses, his options of short and long term treatment based on the latest standards of care / guidelines;   and documenting the encounter.  Timothy Jimenez  participated in the discussions, expressed understanding, and voiced agreement with the above plans.  All questions were answered to his satisfaction. he is encouraged to contact clinic should he have any questions or concerns prior to his return visit.   Follow up plan: Return in  about 4 months (around 04/24/2024) for Thyroid  follow up, Previsit labs.   Thank you for involving me in the care of this pleasant patient, and I will continue to update you with his progress.  Benton Rio, [facility] [address] [place], [place] [address] Phone: [phone] Fax: [phone]  12/24/2023, 9:32 AM

## 2024-03-14 ENCOUNTER — Ambulatory Visit: Admitting: Family Medicine

## 2024-03-14 ENCOUNTER — Ambulatory Visit: Payer: Self-pay

## 2024-03-14 ENCOUNTER — Other Ambulatory Visit: Payer: Self-pay | Admitting: Family Medicine

## 2024-03-14 ENCOUNTER — Encounter: Payer: Self-pay | Admitting: Family Medicine

## 2024-03-14 ENCOUNTER — Ambulatory Visit: Attending: Family Medicine

## 2024-03-14 VITALS — BP 142/76 | HR 45 | Temp 97.9°F | Ht 75.0 in | Wt 255.6 lb

## 2024-03-14 DIAGNOSIS — I10 Essential (primary) hypertension: Secondary | ICD-10-CM

## 2024-03-14 DIAGNOSIS — I499 Cardiac arrhythmia, unspecified: Secondary | ICD-10-CM

## 2024-03-14 DIAGNOSIS — Z23 Encounter for immunization: Secondary | ICD-10-CM | POA: Diagnosis not present

## 2024-03-14 DIAGNOSIS — R002 Palpitations: Secondary | ICD-10-CM

## 2024-03-14 MED ORDER — LOSARTAN POTASSIUM 50 MG PO TABS: 50.0000 mg | ORAL_TABLET | Freq: Every day | ORAL | 3 refills | Status: AC

## 2024-03-14 NOTE — Progress Notes (Unsigned)
 EP to read.

## 2024-03-14 NOTE — Addendum Note (Signed)
 Addended by: ANGELENA RONAL BRADLEY K on: 03/14/2024 03:14 PM   Modules accepted: Orders

## 2024-03-14 NOTE — Progress Notes (Signed)
 Subjective:    Patient ID: Timothy Jimenez, male    DOB: February 19, 1951, 73 y.o.   MRN: [id number]  Patient presents today complaining of elevated blood pressure and recent dental visit.  He was going to have his teeth cleaned and his systolic blood pressure was consistently greater than 150 so they referred him back to my office.  Here today, I checked his blood pressure and found it to be elevated to greater than 16 systolic however his heart rate was extremely erratic.  Therefore I will obtain an EKG today.  EKG shows sinus rhythm however the patient is having frequent PVCs every 2nd-3rd heartbeat.  He does have nonspecific ST changes in lead aVL.  Patient has Q waves in lead III aVF and lead II showing a possible inferior infarct.  The Q waves in leads II  appear to be new compared to an EKG from 2012.  Patient did have a coronary artery calcium  score in 2025 that showed insignificant calcium /plaque in the coronary arteries Past Surgical History:  Procedure Laterality Date   APPENDECTOMY     73 years old   CARDIAC CATHETERIZATION     COLONOSCOPY  02/25/2009   sm int and ext hemms (Dr. Rosalie)    ESOPHAGOGASTRODUODENOSCOPY     medium HH esophagitis ( Dr  Rosalie) 02/25/2009   TONSILLECTOMY     20 yoa   Current Outpatient Medications on File Prior to Visit  Medication Sig Dispense Refill   atorvastatin  (LIPITOR) 20 MG tablet Take 1 tablet (20 mg total) by mouth daily. 90 tablet 3   benzonatate  (TESSALON ) 100 MG capsule Take 1 capsule (100 mg total) by mouth 3 (three) times daily as needed for cough. Do not take with alcohol or while operating or driving heavy machinery (Patient not taking: Reported on 12/24/2023) 21 capsule 0   levothyroxine  (SYNTHROID ) 150 MCG tablet Take 1 tablet (150 mcg total) by mouth daily before breakfast. 90 tablet 1   pantoprazole  (PROTONIX ) 40 MG tablet Take 1 tablet (40 mg total) by mouth daily. 90 tablet 3   silver  sulfADIAZINE  (SILVADENE ) 1 % cream Apply 1 Application  topically daily. 50 g 0   No current facility-administered medications on file prior to visit.    Allergies  Allergen Reactions   Polycillin [Ampicillin]     Hives    Social History   Socioeconomic History   Marital status: Married    Spouse name: [name]   Number of children: 2   Years of education: Not on file   Highest education level: Not on file  Occupational History   Not on file  Tobacco Use   Smoking status: Former    Current packs/day: 0.00    Average packs/day: 0.5 packs/day for 3.0 years (1.5 ttl pk-yrs)    Types: Cigarettes    Start date: 31    Quit date: 100    Years since quitting: 48.9   Smokeless tobacco: Never   Tobacco comments:    stopped amoking 35 years ago  Vaping Use   Vaping status: Never Used  Substance and Sexual Activity   Alcohol use: No   Drug use: No   Sexual activity: Yes  Other Topics Concern   Not on file  Social History Narrative   Marital Status: Married 1984 LIVES WITH WIFE   Children: 2 DAUGHTERS OUT OF HOME   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   Social Drivers of Health  Financial Resource Strain: Low Risk  (06/17/2023)   Overall Financial Resource Strain (CARDIA)    Difficulty of Paying Living Expenses: Not hard at all  Food Insecurity: No Food Insecurity (06/17/2023)   Hunger Vital Sign    Worried About Running Out of Food in the Last Year: Never true    Ran Out of Food in the Last Year: Never true  Transportation Needs: No Transportation Needs (06/17/2023)   PRAPARE - Administrator, Civil Service (Medical): No    Lack of Transportation (Non-Medical): No  Physical Activity: Insufficiently Active (06/17/2023)   Exercise Vital Sign    Days of Exercise per Week: 3 days    Minutes of Exercise per Session: 30 min  Stress: No Stress Concern Present (06/17/2023)   [employer] of Occupational Health - Occupational Stress Questionnaire    Feeling of Stress  : Not at all  Social Connections: Socially Integrated (06/17/2023)   Social Connection and Isolation Panel    Frequency of Communication with Friends and Family: More than three times a week    Frequency of Social Gatherings with Friends and Family: Three times a week    Attends Religious Services: More than 4 times per year    Active Member of Clubs or Organizations: Yes    Attends Banker Meetings: Never    Marital Status: Married  Catering Manager Violence: Not At Risk (06/17/2023)   Humiliation, Afraid, Rape, and Kick questionnaire    Fear of Current or Ex-Partner: No    Emotionally Abused: No    Physically Abused: No    Sexually Abused: No   Family History  Problem Relation Age of Onset   Thyroid  disease Mother    Heart failure Mother    Hypertension Mother    Stroke Mother    Kidney disease Father    Hypertension Father    Kidney failure Father    Heart attack Father    COPD Sister    Rheumatic fever Brother        age 60 h/o w heart complications   Hypertension Brother    Stroke Brother    Heart disease Brother    Liver disease Paternal Aunt    Heart disease Other        many cousins deceased MI x2   Hypertension Other        mothers side   Diabetes Other        mothers side   Lung cancer Other        paternal/maternal grandparents   Prostate cancer Neg Hx    Colon cancer Neg Hx       Review of Systems  All other systems reviewed and are negative.      Objective:   Physical Exam Vitals reviewed.  Constitutional:      General: He is not in acute distress.    Appearance: He is well-developed. He is not diaphoretic.  HENT:     Head: Normocephalic and atraumatic.     Nose: Nose normal.  Eyes:     General: No scleral icterus.       Right eye: No discharge.        Left eye: No discharge.     Conjunctiva/sclera: Conjunctivae normal.     Pupils: Pupils are equal, round, and reactive to light.  Neck:     Thyroid : No thyromegaly.      Vascular: No JVD.     Trachea: No tracheal deviation.  Cardiovascular:  Rate and Rhythm: Normal rate. Rhythm irregular.     Heart sounds: Normal heart sounds. No murmur heard.    No friction rub. No gallop.  Pulmonary:     Effort: Pulmonary effort is normal. No respiratory distress.     Breath sounds: Normal breath sounds. No stridor. No wheezing or rales.  Chest:     Chest wall: No tenderness.  Lymphadenopathy:     Cervical: No cervical adenopathy.  Neurological:     Mental Status: He is alert.     Motor: No abnormal muscle tone.     Deep Tendon Reflexes: Reflexes are normal and symmetric.         Assessment & Plan:  Irregular heart rhythm - Plan: EKG 12-Lead, CBC with Differential/Platelet, Comprehensive metabolic panel with GFR, TSH  Benign essential HTN Patient's blood pressure is elevated.  I will start him on losartan  50 mg daily to lower his blood pressure.  However I am more concerned with the frequency of the PVCs which seem to be a new issue for this patient.  Given his past medical history I am going to check a TSH along with a CBC and a CMP to rule out any endocrinologic or electrolyte issues that can cause this.  I also recommended a 14-day event monitor to determine if the patient is having more significant cardiac arrhythmias.  Also will consult cardiology for an echocardiogram to evaluate further

## 2024-03-14 NOTE — Telephone Encounter (Signed)
 FYI Only or Action Required?: FYI only for provider: appointment scheduled on 03/14/24.  Patient was last seen in primary care on 06/04/2023 by Duanne Butler DASEN, MD.  Called Nurse Triage reporting Hypertension.  Symptoms began today.  Interventions attempted: Nothing.  Symptoms are: stable.  Triage Disposition: See PCP When Office is Open (Within 3 Days)  Patient/caregiver understands and will follow disposition?: Yes    Copied from CRM #[id number]. Topic: General - Other >> Mar 14, 2024 10:46 AM Tonda B wrote: Reason for CRM: pt  went to dentist to get work done and was told his blood pressure was to high 162/92 152/82 Reason for Disposition  Systolic BP >= 160 OR Diastolic >= 100  Answer Assessment - Initial Assessment Questions Pt went to dentist today for a cleaning. BP was 162/92 and 152/82 at the time. Was unable to perform cleaning without PCP approval. Denies CP, SOB, nausea, numbness tingling, weakness, HA or blurred vision. Denies hx of htn, not taking any medication. Scheduled appt with PCP today. Advised UC or ED for worsening symptoms and to ask provider about obtaining a BP cuff to monitor at home.   1. BLOOD PRESSURE: What is your blood pressure? Did you take at least two measurements 5 minutes apart?     162/92 left arm, 152/82 right arm, 156/82 wrist cuff  2. ONSET: When did you take your blood pressure?     Today  3. HOW: How did you take your blood pressure? (e.g., automatic home BP monitor, visiting nurse)     At dentist office today  4. HISTORY: Do you have a history of high blood pressure?     Denies  5. MEDICINES: Are you taking any medicines for blood pressure? Have you missed any doses recently?     Denies  6. OTHER SYMPTOMS: Do you have any symptoms? (e.g., blurred vision, chest pain, difficulty breathing, headache, weakness)     Denies  Protocols used: Blood Pressure - High-A-AH

## 2024-03-15 LAB — COMPREHENSIVE METABOLIC PANEL WITH GFR
AG Ratio: 1.7 (calc) (ref 1.0–2.5)
ALT: 15 U/L (ref 9–46)
AST: 15 U/L (ref 10–35)
Albumin: 4.4 g/dL (ref 3.6–5.1)
Alkaline phosphatase (APISO): 100 U/L (ref 35–144)
BUN: 13 mg/dL (ref 7–25)
CO2: 29 mmol/L (ref 20–32)
Calcium: 9 mg/dL (ref 8.6–10.3)
Chloride: 104 mmol/L (ref 98–110)
Creat: 1.09 mg/dL (ref 0.70–1.28)
Globulin: 2.6 g/dL (ref 1.9–3.7)
Glucose, Bld: 106 mg/dL — ABNORMAL HIGH (ref 65–99)
Potassium: 3.6 mmol/L (ref 3.5–5.3)
Sodium: 140 mmol/L (ref 135–146)
Total Bilirubin: 0.5 mg/dL (ref 0.2–1.2)
Total Protein: 7 g/dL (ref 6.1–8.1)
eGFR: 72 mL/min/1.73m2 (ref >=60)

## 2024-03-15 LAB — CBC WITH DIFFERENTIAL/PLATELET
Absolute Lymphocytes: 1339 {cells}/uL (ref 850–3900)
Absolute Monocytes: 676 {cells}/uL (ref 200–950)
Basophils Absolute: 39 {cells}/uL (ref 0–200)
Basophils Relative: 0.6 %
Eosinophils Absolute: 137 {cells}/uL (ref 15–500)
Eosinophils Relative: 2.1 %
HCT: 44 % (ref 39.4–51.1)
Hemoglobin: 14.3 g/dL (ref 13.2–17.1)
MCH: 30.4 pg (ref 27.0–33.0)
MCHC: 32.5 g/dL (ref 31.6–35.4)
MCV: 93.4 fL (ref 81.4–101.7)
MPV: 10.9 fL (ref 7.5–12.5)
Monocytes Relative: 10.4 %
Neutro Abs: 4310 {cells}/uL (ref 1500–7800)
Neutrophils Relative %: 66.3 %
Platelets: 226 Thousand/uL (ref 140–400)
RBC: 4.71 Million/uL (ref 4.20–5.80)
RDW: 12.5 % (ref 11.0–15.0)
Total Lymphocyte: 20.6 %
WBC: 6.5 Thousand/uL (ref 3.8–10.8)

## 2024-03-15 LAB — TSH: TSH: 0.41 m[IU]/L (ref 0.40–4.50)

## 2024-03-16 ENCOUNTER — Ambulatory Visit: Payer: Self-pay | Admitting: Family Medicine

## 2024-03-23 ENCOUNTER — Ambulatory Visit (HOSPITAL_COMMUNITY)
Admission: RE | Admit: 2024-03-23 | Discharge: 2024-03-23 | Disposition: A | Source: Ambulatory Visit | Attending: Family Medicine | Admitting: Family Medicine

## 2024-03-23 DIAGNOSIS — I499 Cardiac arrhythmia, unspecified: Secondary | ICD-10-CM | POA: Diagnosis not present

## 2024-03-23 DIAGNOSIS — I498 Other specified cardiac arrhythmias: Secondary | ICD-10-CM | POA: Diagnosis not present

## 2024-03-23 DIAGNOSIS — I10 Essential (primary) hypertension: Secondary | ICD-10-CM

## 2024-03-23 DIAGNOSIS — R002 Palpitations: Secondary | ICD-10-CM | POA: Diagnosis present

## 2024-03-23 DIAGNOSIS — I082 Rheumatic disorders of both aortic and tricuspid valves: Secondary | ICD-10-CM | POA: Diagnosis not present

## 2024-03-23 LAB — ECHOCARDIOGRAM COMPLETE
AR max vel: 2.77 cm2
AV Area VTI: 3.15 cm2
AV Area mean vel: 2.63 cm2
AV Mean grad: 3 mmHg
AV Peak grad: 4.2 mmHg
Ao pk vel: 1.03 m/s
Area-P 1/2: 2.85 cm2
S' Lateral: 3.2 cm

## 2024-04-13 ENCOUNTER — Telehealth: Payer: Self-pay

## 2024-04-13 NOTE — Telephone Encounter (Signed)
 Copied from CRM [id number]. Topic: Clinical - Medication Question >> Apr 13, 2024 10:28 AM Hadassah PARAS wrote: Reason for CRM: Pt is following up on a heart monitor device. He was advised it would be sent order via mail. Please advise pt on #[id number].

## 2024-04-14 ENCOUNTER — Other Ambulatory Visit: Payer: Self-pay

## 2024-04-14 DIAGNOSIS — I499 Cardiac arrhythmia, unspecified: Secondary | ICD-10-CM

## 2024-04-14 MED ORDER — METOPROLOL SUCCINATE ER 25 MG PO TB24: 25.0000 mg | ORAL_TABLET | Freq: Every day | ORAL | 1 refills | Status: AC

## 2024-04-16 ENCOUNTER — Other Ambulatory Visit: Payer: Self-pay | Admitting: Cardiology

## 2024-04-16 DIAGNOSIS — I493 Ventricular premature depolarization: Secondary | ICD-10-CM

## 2024-04-16 DIAGNOSIS — I10 Essential (primary) hypertension: Secondary | ICD-10-CM | POA: Diagnosis not present

## 2024-04-16 DIAGNOSIS — R002 Palpitations: Secondary | ICD-10-CM

## 2024-04-16 DIAGNOSIS — I499 Cardiac arrhythmia, unspecified: Secondary | ICD-10-CM | POA: Diagnosis not present

## 2024-04-18 LAB — TSH: TSH: 0.574 u[IU]/mL (ref 0.450–4.500)

## 2024-04-18 LAB — T4, FREE: Free T4: 1.45 ng/dL (ref 0.82–1.77)

## 2024-04-27 ENCOUNTER — Encounter: Payer: Self-pay | Admitting: Nurse Practitioner

## 2024-04-27 ENCOUNTER — Ambulatory Visit: Admitting: Nurse Practitioner

## 2024-04-27 VITALS — BP 146/67 | HR 78 | Resp 18 | Ht 75.0 in | Wt 259.0 lb

## 2024-04-27 DIAGNOSIS — E890 Postprocedural hypothyroidism: Secondary | ICD-10-CM

## 2024-04-27 MED ORDER — LEVOTHYROXINE SODIUM 150 MCG PO TABS: 150.0000 ug | ORAL_TABLET | Freq: Every day | ORAL | 1 refills | Status: AC

## 2024-04-27 NOTE — Progress Notes (Signed)
 "         04/27/2024     Endocrinology Follow Up Note    Subjective:    Patient ID: Timothy Jimenez, male    DOB: Dec 29, 1950, PCP Duanne Butler DASEN, MD.   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 Rosalie)    ESOPHAGOGASTRODUODENOSCOPY     medium HH esophagitis ( Dr  Rosalie) 02/25/2009   TONSILLECTOMY     20 yoa    Social History   Socioeconomic History   Marital status: Married    Spouse name: [name]   Number of children: 2   Years of education: Not on file   Highest education level: Not on file  Occupational History   Not on file  Tobacco Use   Smoking status: Former    Current packs/day: 0.00    Average packs/day: 0.5 packs/day for 3.0 years (1.5 ttl pk-yrs)    Types: Cigarettes    Start date: 37    Quit date: 76    Years since quitting: 49.0   Smokeless tobacco: Never   Tobacco comments:    stopped amoking 35 years ago  Vaping Use   Vaping status: Never Used  Substance and Sexual Activity   Alcohol use: No   Drug use: No   Sexual activity: Yes  Other Topics Concern   Not on file  Social History Narrative   Marital Status: Married 1984 LIVES WITH WIFE   Children: 2 DAUGHTERS OUT OF HOME   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   Social Drivers of Health   Tobacco Use: Medium Risk (04/27/2024)   Patient History    Smoking Tobacco Use: Former    Smokeless Tobacco  Use: Never    Passive Exposure: Not on file  Financial Resource Strain: Low Risk (06/17/2023)   Overall Financial Resource Strain (CARDIA)    Difficulty of Paying Living Expenses: Not hard at all  Food Insecurity: No Food Insecurity (06/17/2023)   Hunger Vital Sign    Worried About Running Out of Food in the Last Year: Never true    Ran Out of Food in the Last Year: Never true  Transportation Needs: No Transportation Needs (06/17/2023)   PRAPARE - Administrator, Civil Service (Medical): No    Lack of Transportation (Non-Medical): No  Physical Activity: Insufficiently Active (06/17/2023)   Exercise Vital Sign    Days of Exercise per Week: 3 days    Minutes of Exercise per Session: 30 min  Stress: No Stress Concern Present (06/17/2023)   [employer] of Occupational Health - Occupational Stress Questionnaire    Feeling of Stress : Not at all  Social Connections: Socially Integrated (06/17/2023)   Social Connection and Isolation Panel    Frequency of Communication with Friends and Family: More than three  times a week    Frequency of Social Gatherings with Friends and Family: Three times a week    Attends Religious Services: More than 4 times per year    Active Member of Clubs or Organizations: Yes    Attends Banker Meetings: Never    Marital Status: Married  Depression (PHQ2-9): Low Risk (06/17/2023)   Depression (PHQ2-9)    PHQ-2 Score: 0  Alcohol Screen: Low Risk (06/17/2023)   Alcohol Screen    Last Alcohol Screening Score (AUDIT): 0  Housing: Unknown (06/17/2023)   Housing Stability Vital Sign    Unable to Pay for Housing in the Last Year: No    Number of Times Moved in the Last Year: Not on file    Homeless in the Last Year: No  Utilities: Not At Risk (06/17/2023)   AHC Utilities    Threatened with loss of utilities: No  Health Literacy: Adequate Health Literacy (06/17/2023)   B1300 Health Literacy    Frequency of need for help with medical  instructions: Never    Family History  Problem Relation Age of Onset   Thyroid  disease Mother    Heart failure Mother    Hypertension Mother    Stroke Mother    Kidney disease Father    Hypertension Father    Kidney failure Father    Heart attack Father    COPD Sister    Rheumatic fever Brother        age 29 h/o w heart complications   Hypertension Brother    Stroke Brother    Heart disease Brother    Liver disease Paternal Aunt    Heart disease Other        many cousins deceased MI x2   Hypertension Other        mothers side   Diabetes Other        mothers side   Lung cancer Other        paternal/maternal grandparents   Prostate cancer Neg Hx    Colon cancer Neg Hx     Outpatient Encounter Medications as of 04/27/2024  Medication Sig   atorvastatin  (LIPITOR) 20 MG tablet Take 1 tablet (20 mg total) by mouth daily.   losartan  (COZAAR ) 50 MG tablet Take 1 tablet (50 mg total) by mouth daily.   metoprolol  succinate (TOPROL -XL) 25 MG 24 hr tablet Take 1 tablet (25 mg total) by mouth daily.   pantoprazole  (PROTONIX ) 40 MG tablet Take 1 tablet (40 mg total) by mouth daily.   silver  sulfADIAZINE  (SILVADENE ) 1 % cream Apply 1 Application topically daily.   [DISCONTINUED] levothyroxine  (SYNTHROID ) 150 MCG tablet Take 1 tablet (150 mcg total) by mouth daily before breakfast.   benzonatate  (TESSALON ) 100 MG capsule Take 1 capsule (100 mg total) by mouth 3 (three) times daily as needed for cough. Do not take with alcohol or while operating or driving heavy machinery (Patient not taking: Reported on 04/27/2024)   levothyroxine  (SYNTHROID ) 150 MCG tablet Take 1 tablet (150 mcg total) by mouth daily before breakfast.   No facility-administered encounter medications on file as of 04/27/2024.    ALLERGIES: Allergies  Allergen Reactions   Polycillin [Ampicillin]     Hives     VACCINATION STATUS: Immunization History  Administered Date(s) Administered   Fluad Quad(high Dose 65+)  02/16/2019, 02/12/2022   Fluad Trivalent(High Dose 65+) 02/15/2023   INFLUENZA, HIGH DOSE SEASONAL PF 01/07/2018, 03/14/2024   Influenza-Unspecified 02/18/2021   PFIZER(Purple Top)SARS-COV-2 Vaccination 11/17/2019, 12/08/2019  Research Officer, [employer] 73yrs & up 05/10/2020   Pneumococcal Conjugate-13 09/01/2016   Pneumococcal Polysaccharide-23 09/03/2015   Td 04/06/2001   Tdap 10/23/2011, 07/02/2018   Zoster Recombinant(Shingrix) 06/17/2022   Zoster, Live 04/06/2013     HPI  Timothy Jimenez is 74 y.o. male who presents today with a medical history as above. he is being seen in follow up after being seen in consultation for hyperthyroidism requested by Duanne Butler DASEN, MD.  He was diagnosed years ago with under-active thyroid  and had been on thyroid  hormone replacement up until about 12 weeks ago where he was taken off due to undetectable TSH. he has been dealing with symptoms of heat intolerance, fatigue, unintentional weight loss, anxiety, palpitation, diarrhea, and worsening hoarseness for ?the last 6 months but his wife noticed this change starting around 1 year ago. These symptoms are progressively worsening and troubling to him.   He had positive TPO antibodies at 89, indicating autoimmune thyroid  dysfunction.  he denies dysphagia, choking, shortness of breath, but wife has noticed some recent voice change.    he does have family history of thyroid  dysfunction in his mother, but denies family hx of thyroid  cancer. he denies personal history of goiter. he is not currently on any anti-thyroid  medications nor on any thyroid  hormone supplements. Denies use of Biotin containing supplements.  He had RAI for Graves disease on 07/23/21.  Review of systems  Constitutional: + increasing body weight,  current Body mass index is 32.37 kg/m. , + fatigue- somewhat improved-notes some worsening on Mondays, no subjective hyperthermia, no subjective hypothermia Eyes: no blurry  vision, no xerophthalmia ENT: no sore throat, no nodules palpated in throat, no dysphagia/odynophagia, no hoarseness Cardiovascular: no chest pain, no shortness of breath, no palpitations, no leg swelling Respiratory: no cough, no shortness of breath Gastrointestinal: no nausea/vomiting/diarrhea Musculoskeletal: + diffuse muscle/joint aches-stable Skin: no rashes, no hyperemia Neurological: no tremors, no numbness, no tingling, no dizziness Psychiatric: no depression, no anxiety   Objective:    BP (!) 146/67   Pulse 78   Resp 18   Ht 6' 3 (1.905 m)   Wt 259 lb (117.5 kg)   BMI 32.37 kg/m   Wt Readings from Last 3 Encounters:  04/27/24 259 lb (117.5 kg)  03/14/24 255 lb 9.6 oz (115.9 kg)  12/24/23 251 lb 9.6 oz (114.1 kg)     BP Readings from Last 3 Encounters:  04/27/24 (!) 146/67  03/14/24 (!) 142/76  12/24/23 122/64                         Physical Exam- Limited  Constitutional:  Body mass index is 32.37 kg/m. , not in acute distress, normal state of mind Eyes:  EOMI, no exophthalmos Musculoskeletal: no gross deformities, strength intact in all four extremities, no gross restriction of joint movements Skin:  no rashes, no hyperemia Neurological: no tremor with outstretched hands   CMP     Component Value Date/Time   NA 140 03/14/2024 1458   K 3.6 03/14/2024 1458   CL 104 03/14/2024 1458   CO2 29 03/14/2024 1458   GLUCOSE 106 (H) 03/14/2024 1458   BUN 13 03/14/2024 1458   BUN 17 06/29/2022 0000   CREATININE 1.09 03/14/2024 1458   CALCIUM  9.0 03/14/2024 1458   PROT 7.0 03/14/2024 1458   ALBUMIN 4.0 06/29/2022 0000   AST 15 03/14/2024 1458   ALT 15 03/14/2024 1458   ALKPHOS 91 06/29/2022 0000  BILITOT 0.5 03/14/2024 1458   GFRNONAA 62 08/26/2020 1452   GFRAA 71 08/26/2020 1452     CBC    Component Value Date/Time   WBC 6.5 03/14/2024 1458   RBC 4.71 03/14/2024 1458   HGB 14.3 03/14/2024 1458   HCT 44.0 03/14/2024 1458   PLT 226 03/14/2024 1458    MCV 93.4 03/14/2024 1458   MCH 30.4 03/14/2024 1458   MCHC 32.5 03/14/2024 1458   RDW 12.5 03/14/2024 1458   LYMPHSABS 1,108 06/19/2022 0805   MONOABS 574 08/07/2016 0858   EOSABS 137 03/14/2024 1458   BASOSABS 39 03/14/2024 1458     Diabetic Labs (most recent): Lab Results  Component Value Date   HGBA1C 5.7 06/18/2011   MICROALBUR 1.6 08/05/2009    Lipid Panel     Component Value Date/Time   CHOL 158 05/17/2023 1106   TRIG 107 05/17/2023 1106   HDL 43 05/17/2023 1106   CHOLHDL 3.7 05/17/2023 1106   VLDL 11 08/07/2016 0858   LDLCALC 95 05/17/2023 1106     Lab Results  Component Value Date   TSH 0.574 04/17/2024   TSH 0.41 03/14/2024   TSH 0.183 (L) 12/14/2023   TSH 0.072 (L) 09/20/2023   TSH 0.058 (L) 06/09/2023   TSH 0.06 (L) 05/17/2023   TSH 0.376 (L) 02/08/2023   TSH 0.460 10/12/2022   TSH 6.29 (A) 06/24/2022   TSH 6.29 (H) 06/19/2022   FREET4 1.45 04/17/2024   FREET4 1.72 12/14/2023   FREET4 1.93 (H) 09/20/2023   FREET4 1.76 06/09/2023   FREET4 1.76 02/08/2023   FREET4 1.61 10/12/2022   FREET4 1.2 06/19/2022   FREET4 1.28 04/30/2022   FREET4 1.21 03/02/2022   FREET4 1.10 12/29/2021    Uptake and Scan 06/26/21  CLINICAL DATA:  Hypothyroidism, cessation of levothyroxine  therapy 12 weeks ago   EXAM: THYROID  SCAN AND UPTAKE - 4 AND 24 HOURS   TECHNIQUE: Following oral administration of I-123 capsule, anterior planar imaging was acquired at 24 hours. Thyroid  uptake was calculated with a thyroid  probe at 4-6 hours and 24 hours.   RADIOPHARMACEUTICALS:  Four under 24 uCi I-123 sodium iodide p.o.   COMPARISON:  No relevant prior studies available for comparison at this institution   FINDINGS: Planar imaging the thyroid  demonstrates normal homogeneous symmetrical uptake within the thyroid . No focal lesions identified.   4 hour I-123 uptake = 20.3% (normal 5-20%)   24 hour I-123 uptake = 45.7% (normal 10-30%)   IMPRESSION: 1. Elevated iodine   uptake values at 4 hours and 24 hours. 2. No focal parenchymal abnormalities identified on imaging.     Electronically Signed   By: Ozell Daring M.D.   On: 06/26/2021 17:26   Latest Reference Range & Units 06/09/23 14:59 09/20/23 08:33 12/14/23 09:36 03/14/24 14:58 04/17/24 09:58  TSH 0.450 - 4.500 uIU/mL 0.058 (L) 0.072 (L) 0.183 (L) 0.41 0.574  T4,Free(Direct) 0.82 - 1.77 ng/dL 8.23 8.06 (H) 8.27  8.54  (L): Data is abnormally low (H): Data is abnormally high  Assessment & Plan:   1. Hypothyroidism-s/p RAI for Graves Disease  he is being seen at a kind request of Duanne Butler DASEN, MD.  He had RAI for Graves disease on 07/23/21.  -His previsit TFTs are consistent with appropriate hormone replacement.  He is advised to continue his Levothyroxine  150 mcg po daily taking half a tablet on 2 days each week.     Historically the 137 mcg dose was not enough.   - The  correct intake of thyroid  hormone (Levothyroxine , Synthroid ), is on empty stomach first thing in the morning, with water, separated by at least 30 minutes from breakfast and other medications,  and separated by more than 4 hours from calcium , iron, multivitamins, acid reflux medications (PPIs).  - This medication is a life-long medication and will be needed to correct thyroid  hormone imbalances for the rest of your life.  The dose may change from time to time, based on thyroid  blood work.  - It is extremely important to be consistent taking this medication, near the same time each morning.  -AVOID TAKING PRODUCTS CONTAINING BIOTIN (commonly found in Hair, Skin, Nails vitamins) AS IT INTERFERES WITH THE VALIDITY OF THYROID  FUNCTION BLOOD TESTS.      -Patient is advised to maintain close follow up with Duanne Butler DASEN, MD for primary care needs.    I spent  21  minutes in the care of the patient today including review of labs from Thyroid  Function, CMP, and other relevant labs ; imaging/biopsy records (current and  previous including abstractions from other facilities); face-to-face time discussing  his lab results and symptoms, medications doses, his options of short and long term treatment based on the latest standards of care / guidelines;   and documenting the encounter.  Timothy Jimenez  participated in the discussions, expressed understanding, and voiced agreement with the above plans.  All questions were answered to his satisfaction. he is encouraged to contact clinic should he have any questions or concerns prior to his return visit.   Follow up plan: Return in about 6 months (around 10/25/2024) for Thyroid  follow up, Previsit labs.   Thank you for involving me in the care of this pleasant patient, and I will continue to update you with his progress.  Benton Rio, [facility] [address] [place], [place] [address] Phone: [phone] Fax: [phone]  04/27/2024, 9:52 AM "

## 2024-04-27 NOTE — Patient Instructions (Signed)

## 2024-05-26 ENCOUNTER — Ambulatory Visit: Admitting: Student in an Organized Health Care Education/Training Program

## 2024-10-25 ENCOUNTER — Ambulatory Visit: Admitting: Nurse Practitioner
